# Patient Record
Sex: Female | Born: 1955 | Race: White | Hispanic: No | Marital: Married | Smoking: Never smoker
Health system: Southern US, Community
[De-identification: ages and names within clinical notes are randomized; demographics above are authoritative.]

## PROBLEM LIST (undated history)

## (undated) DIAGNOSIS — G4730 Sleep apnea, unspecified: Secondary | ICD-10-CM

## (undated) DIAGNOSIS — K589 Irritable bowel syndrome without diarrhea: Secondary | ICD-10-CM

## (undated) DIAGNOSIS — E559 Vitamin D deficiency, unspecified: Secondary | ICD-10-CM

## (undated) DIAGNOSIS — D126 Benign neoplasm of colon, unspecified: Secondary | ICD-10-CM

## (undated) DIAGNOSIS — E079 Disorder of thyroid, unspecified: Secondary | ICD-10-CM

## (undated) DIAGNOSIS — K219 Gastro-esophageal reflux disease without esophagitis: Secondary | ICD-10-CM

## (undated) DIAGNOSIS — F329 Major depressive disorder, single episode, unspecified: Secondary | ICD-10-CM

## (undated) DIAGNOSIS — L719 Rosacea, unspecified: Secondary | ICD-10-CM

## (undated) DIAGNOSIS — Z860100 Personal history of colon polyps, unspecified: Secondary | ICD-10-CM

## (undated) DIAGNOSIS — I10 Essential (primary) hypertension: Secondary | ICD-10-CM

## (undated) DIAGNOSIS — E039 Hypothyroidism, unspecified: Secondary | ICD-10-CM

## (undated) DIAGNOSIS — F32A Depression, unspecified: Secondary | ICD-10-CM

## (undated) DIAGNOSIS — K449 Diaphragmatic hernia without obstruction or gangrene: Secondary | ICD-10-CM

## (undated) HISTORY — PX: BREAST EXCISIONAL BIOPSY: SUR124

## (undated) HISTORY — DX: Benign neoplasm of colon, unspecified: D12.6

## (undated) HISTORY — PX: VULVA SURGERY: SHX837

## (undated) HISTORY — PX: BREAST LUMPECTOMY: SHX2

## (undated) HISTORY — DX: Sleep apnea, unspecified: G47.30

## (undated) HISTORY — DX: Essential (primary) hypertension: I10

## (undated) HISTORY — DX: Personal history of colonic polyps: Z86.010

## (undated) HISTORY — DX: Disorder of thyroid, unspecified: E07.9

## (undated) HISTORY — PX: BUNIONECTOMY: SHX129

## (undated) HISTORY — PX: CHOLECYSTECTOMY: SHX55

## (undated) HISTORY — DX: Rosacea, unspecified: L71.9

## (undated) HISTORY — PX: OTHER SURGICAL HISTORY: SHX169

## (undated) HISTORY — DX: Irritable bowel syndrome, unspecified: K58.9

## (undated) HISTORY — DX: Personal history of colon polyps, unspecified: Z86.0100

## (undated) HISTORY — DX: Vitamin D deficiency, unspecified: E55.9

## (undated) HISTORY — DX: Diaphragmatic hernia without obstruction or gangrene: K44.9

---

## 1998-02-03 ENCOUNTER — Ambulatory Visit (HOSPITAL_COMMUNITY): Admission: RE | Admit: 1998-02-03 | Discharge: 1998-02-03 | Payer: Self-pay | Admitting: Gastroenterology

## 1998-06-17 HISTORY — PX: RHINOPLASTY: SUR1284

## 1998-06-17 HISTORY — PX: OTHER SURGICAL HISTORY: SHX169

## 1998-09-05 ENCOUNTER — Ambulatory Visit: Admission: RE | Admit: 1998-09-05 | Discharge: 1998-09-05 | Payer: Self-pay | Admitting: Otolaryngology

## 1998-09-27 ENCOUNTER — Encounter: Payer: Self-pay | Admitting: Gastroenterology

## 1998-09-27 ENCOUNTER — Ambulatory Visit (HOSPITAL_COMMUNITY): Admission: RE | Admit: 1998-09-27 | Discharge: 1998-09-27 | Payer: Self-pay | Admitting: Gastroenterology

## 1998-11-07 ENCOUNTER — Encounter: Payer: Self-pay | Admitting: General Surgery

## 1998-11-07 ENCOUNTER — Ambulatory Visit (HOSPITAL_COMMUNITY): Admission: RE | Admit: 1998-11-07 | Discharge: 1998-11-08 | Payer: Self-pay | Admitting: General Surgery

## 1999-01-03 ENCOUNTER — Ambulatory Visit (HOSPITAL_BASED_OUTPATIENT_CLINIC_OR_DEPARTMENT_OTHER): Admission: RE | Admit: 1999-01-03 | Discharge: 1999-01-03 | Payer: Self-pay | Admitting: General Surgery

## 1999-01-03 ENCOUNTER — Encounter: Payer: Self-pay | Admitting: General Surgery

## 2000-01-29 ENCOUNTER — Ambulatory Visit (HOSPITAL_COMMUNITY): Admission: RE | Admit: 2000-01-29 | Discharge: 2000-01-29 | Payer: Self-pay | Admitting: Internal Medicine

## 2000-01-29 ENCOUNTER — Encounter: Payer: Self-pay | Admitting: Internal Medicine

## 2000-06-02 ENCOUNTER — Emergency Department (HOSPITAL_COMMUNITY): Admission: EM | Admit: 2000-06-02 | Discharge: 2000-06-02 | Payer: Self-pay | Admitting: *Deleted

## 2002-06-17 HISTORY — PX: FINGER SURGERY: SHX640

## 2002-06-29 ENCOUNTER — Encounter (INDEPENDENT_AMBULATORY_CARE_PROVIDER_SITE_OTHER): Payer: Self-pay | Admitting: *Deleted

## 2002-06-29 ENCOUNTER — Encounter: Payer: Self-pay | Admitting: General Surgery

## 2002-06-29 ENCOUNTER — Encounter: Admission: RE | Admit: 2002-06-29 | Discharge: 2002-06-29 | Payer: Self-pay | Admitting: General Surgery

## 2002-09-13 ENCOUNTER — Encounter: Payer: Self-pay | Admitting: General Surgery

## 2002-09-13 ENCOUNTER — Encounter: Admission: RE | Admit: 2002-09-13 | Discharge: 2002-09-13 | Payer: Self-pay | Admitting: General Surgery

## 2002-09-16 ENCOUNTER — Ambulatory Visit (HOSPITAL_BASED_OUTPATIENT_CLINIC_OR_DEPARTMENT_OTHER): Admission: RE | Admit: 2002-09-16 | Discharge: 2002-09-16 | Payer: Self-pay | Admitting: General Surgery

## 2002-09-16 ENCOUNTER — Encounter: Admission: RE | Admit: 2002-09-16 | Discharge: 2002-09-16 | Payer: Self-pay | Admitting: General Surgery

## 2002-09-16 ENCOUNTER — Encounter: Payer: Self-pay | Admitting: General Surgery

## 2002-09-16 ENCOUNTER — Encounter (INDEPENDENT_AMBULATORY_CARE_PROVIDER_SITE_OTHER): Payer: Self-pay | Admitting: Specialist

## 2003-08-05 ENCOUNTER — Encounter: Admission: RE | Admit: 2003-08-05 | Discharge: 2003-08-05 | Payer: Self-pay | Admitting: General Surgery

## 2004-07-10 ENCOUNTER — Ambulatory Visit: Payer: Self-pay | Admitting: Internal Medicine

## 2004-10-04 ENCOUNTER — Encounter: Admission: RE | Admit: 2004-10-04 | Discharge: 2004-10-04 | Payer: Self-pay | Admitting: Obstetrics and Gynecology

## 2005-02-21 ENCOUNTER — Ambulatory Visit: Payer: Self-pay | Admitting: Internal Medicine

## 2005-08-28 ENCOUNTER — Ambulatory Visit: Payer: Self-pay | Admitting: Internal Medicine

## 2005-10-09 ENCOUNTER — Encounter: Admission: RE | Admit: 2005-10-09 | Discharge: 2005-10-09 | Payer: Self-pay | Admitting: Obstetrics and Gynecology

## 2006-04-04 ENCOUNTER — Ambulatory Visit: Payer: Self-pay | Admitting: Internal Medicine

## 2006-04-04 LAB — CONVERTED CEMR LAB
Chol/HDL Ratio, serum: 2.8
Cholesterol: 186 mg/dL (ref 0–200)
HCT: 47.5 % — ABNORMAL HIGH (ref 36.0–46.0)
Hemoglobin: 16.3 g/dL — ABNORMAL HIGH (ref 12.0–15.0)
INR: 1 (ref 0.9–2.0)
LDL Cholesterol: 95 mg/dL (ref 0–99)
Monocytes Absolute: 1 10*3/uL — ABNORMAL HIGH (ref 0.2–0.7)
Neutro Abs: 8 10*3/uL — ABNORMAL HIGH (ref 1.4–7.7)
Platelets: 298 10*3/uL (ref 150–400)
Prothrombin Time: 12.3 s (ref 10.0–14.0)
RBC: 5.07 M/uL (ref 3.87–5.11)
RDW: 12.6 % (ref 11.5–14.6)
TSH: 1.56 microintl units/mL (ref 0.35–5.50)
WBC: 10.7 10*3/uL — ABNORMAL HIGH (ref 4.5–10.5)

## 2006-08-05 ENCOUNTER — Ambulatory Visit (HOSPITAL_BASED_OUTPATIENT_CLINIC_OR_DEPARTMENT_OTHER): Admission: RE | Admit: 2006-08-05 | Discharge: 2006-08-05 | Payer: Self-pay | Admitting: Orthopedic Surgery

## 2006-09-11 ENCOUNTER — Encounter: Admission: RE | Admit: 2006-09-11 | Discharge: 2006-09-11 | Payer: Self-pay | Admitting: Orthopedic Surgery

## 2007-01-22 ENCOUNTER — Encounter: Admission: RE | Admit: 2007-01-22 | Discharge: 2007-01-22 | Payer: Self-pay | Admitting: Obstetrics and Gynecology

## 2007-03-09 ENCOUNTER — Encounter: Payer: Self-pay | Admitting: Internal Medicine

## 2007-03-17 ENCOUNTER — Encounter: Payer: Self-pay | Admitting: Internal Medicine

## 2007-04-03 ENCOUNTER — Encounter: Payer: Self-pay | Admitting: Internal Medicine

## 2007-04-24 ENCOUNTER — Encounter: Payer: Self-pay | Admitting: Internal Medicine

## 2007-07-03 ENCOUNTER — Ambulatory Visit: Payer: Self-pay | Admitting: Internal Medicine

## 2007-07-03 DIAGNOSIS — E8881 Metabolic syndrome: Secondary | ICD-10-CM | POA: Insufficient documentation

## 2007-07-03 DIAGNOSIS — T887XXA Unspecified adverse effect of drug or medicament, initial encounter: Secondary | ICD-10-CM | POA: Insufficient documentation

## 2007-07-03 DIAGNOSIS — R209 Unspecified disturbances of skin sensation: Secondary | ICD-10-CM | POA: Insufficient documentation

## 2007-07-03 DIAGNOSIS — D692 Other nonthrombocytopenic purpura: Secondary | ICD-10-CM | POA: Insufficient documentation

## 2007-07-03 DIAGNOSIS — E782 Mixed hyperlipidemia: Secondary | ICD-10-CM | POA: Insufficient documentation

## 2007-07-03 DIAGNOSIS — E039 Hypothyroidism, unspecified: Secondary | ICD-10-CM | POA: Insufficient documentation

## 2007-07-03 DIAGNOSIS — K209 Esophagitis, unspecified without bleeding: Secondary | ICD-10-CM | POA: Insufficient documentation

## 2007-07-04 LAB — CONVERTED CEMR LAB
ALT: 25 units/L (ref 0–35)
Albumin: 3.8 g/dL (ref 3.5–5.2)
Alkaline Phosphatase: 58 units/L (ref 39–117)
Basophils Absolute: 0 10*3/uL (ref 0.0–0.1)
Bilirubin, Direct: 0.1 mg/dL (ref 0.0–0.3)
Calcium: 9.7 mg/dL (ref 8.4–10.5)
Eosinophils Absolute: 0.1 10*3/uL (ref 0.0–0.6)
Eosinophils Relative: 0.5 % (ref 0.0–5.0)
GFR calc non Af Amer: 70 mL/min
HDL: 94.1 mg/dL (ref >39.0)
Hemoglobin: 15.6 g/dL — ABNORMAL HIGH (ref 12.0–15.0)
MCV: 94.8 fL (ref 78.0–100.0)
Monocytes Absolute: 1 10*3/uL — ABNORMAL HIGH (ref 0.2–0.7)
Neutro Abs: 7.6 10*3/uL (ref 1.4–7.7)
Platelets: 311 10*3/uL (ref 150–400)
Potassium: 3.9 meq/L (ref 3.5–5.1)
RDW: 13.3 % (ref 11.5–14.6)
TSH: 1.61 microintl units/mL (ref 0.35–5.50)
Total CHOL/HDL Ratio: 2.4
Total Protein: 6.3 g/dL (ref 6.0–8.3)
Triglycerides: 73 mg/dL (ref 0–149)
VLDL: 15 mg/dL (ref 0–40)
Vitamin B-12: 320 pg/mL (ref 211–911)
WBC: 10.5 10*3/uL (ref 4.5–10.5)

## 2007-07-06 ENCOUNTER — Encounter (INDEPENDENT_AMBULATORY_CARE_PROVIDER_SITE_OTHER): Payer: Self-pay | Admitting: *Deleted

## 2007-07-09 ENCOUNTER — Encounter: Payer: Self-pay | Admitting: Internal Medicine

## 2007-08-21 ENCOUNTER — Encounter: Payer: Self-pay | Admitting: Internal Medicine

## 2007-10-08 ENCOUNTER — Encounter: Payer: Self-pay | Admitting: Internal Medicine

## 2007-10-12 ENCOUNTER — Ambulatory Visit: Payer: Self-pay | Admitting: Internal Medicine

## 2007-12-04 ENCOUNTER — Encounter (INDEPENDENT_AMBULATORY_CARE_PROVIDER_SITE_OTHER): Payer: Self-pay | Admitting: Obstetrics and Gynecology

## 2007-12-04 ENCOUNTER — Ambulatory Visit (HOSPITAL_COMMUNITY): Admission: RE | Admit: 2007-12-04 | Discharge: 2007-12-04 | Payer: Self-pay | Admitting: Obstetrics and Gynecology

## 2008-02-24 ENCOUNTER — Encounter: Payer: Self-pay | Admitting: Internal Medicine

## 2008-03-10 ENCOUNTER — Ambulatory Visit (HOSPITAL_COMMUNITY): Admission: RE | Admit: 2008-03-10 | Discharge: 2008-03-10 | Payer: Self-pay | Admitting: Gastroenterology

## 2008-03-24 ENCOUNTER — Encounter: Payer: Self-pay | Admitting: Internal Medicine

## 2008-04-12 ENCOUNTER — Encounter: Admission: RE | Admit: 2008-04-12 | Discharge: 2008-04-12 | Payer: Self-pay | Admitting: Obstetrics and Gynecology

## 2008-04-29 ENCOUNTER — Encounter: Payer: Self-pay | Admitting: Internal Medicine

## 2008-11-09 ENCOUNTER — Ambulatory Visit: Payer: Self-pay | Admitting: Internal Medicine

## 2008-11-13 LAB — CONVERTED CEMR LAB
Albumin: 3.9 g/dL (ref 3.5–5.2)
Calcium: 9.1 mg/dL (ref 8.4–10.5)
Creatinine, Ser: 0.8 mg/dL (ref 0.4–1.2)
Direct LDL: 109.6 mg/dL
Glucose, Bld: 86 mg/dL (ref 70–99)
Phosphorus: 3.4 mg/dL (ref 2.3–4.6)
Sodium: 140 meq/L (ref 135–145)

## 2008-11-15 ENCOUNTER — Encounter (INDEPENDENT_AMBULATORY_CARE_PROVIDER_SITE_OTHER): Payer: Self-pay | Admitting: *Deleted

## 2008-11-30 ENCOUNTER — Encounter: Payer: Self-pay | Admitting: Internal Medicine

## 2009-03-17 ENCOUNTER — Telehealth (INDEPENDENT_AMBULATORY_CARE_PROVIDER_SITE_OTHER): Payer: Self-pay | Admitting: *Deleted

## 2009-04-19 ENCOUNTER — Encounter: Admission: RE | Admit: 2009-04-19 | Discharge: 2009-04-19 | Payer: Self-pay | Admitting: Obstetrics and Gynecology

## 2009-04-24 ENCOUNTER — Encounter: Admission: RE | Admit: 2009-04-24 | Discharge: 2009-04-24 | Payer: Self-pay | Admitting: Obstetrics and Gynecology

## 2009-10-09 ENCOUNTER — Telehealth (INDEPENDENT_AMBULATORY_CARE_PROVIDER_SITE_OTHER): Payer: Self-pay | Admitting: *Deleted

## 2010-02-08 ENCOUNTER — Ambulatory Visit: Payer: Self-pay | Admitting: Internal Medicine

## 2010-02-08 DIAGNOSIS — I10 Essential (primary) hypertension: Secondary | ICD-10-CM | POA: Insufficient documentation

## 2010-02-08 LAB — CONVERTED CEMR LAB
Cholesterol, target level: 200 mg/dL
HDL goal, serum: 40 mg/dL
LDL Goal: 160 mg/dL

## 2010-02-13 ENCOUNTER — Telehealth (INDEPENDENT_AMBULATORY_CARE_PROVIDER_SITE_OTHER): Payer: Self-pay | Admitting: *Deleted

## 2010-02-20 ENCOUNTER — Ambulatory Visit: Payer: Self-pay | Admitting: Internal Medicine

## 2010-02-22 ENCOUNTER — Encounter: Payer: Self-pay | Admitting: Internal Medicine

## 2010-02-23 LAB — CONVERTED CEMR LAB
ALT: 20 units/L (ref 0–35)
AST: 18 units/L (ref 0–37)
Albumin: 4 g/dL (ref 3.5–5.2)
BUN: 20 mg/dL (ref 6–23)
Basophils Relative: 0.4 % (ref 0.0–3.0)
Calcium: 9 mg/dL (ref 8.4–10.5)
Chloride: 104 meq/L (ref 96–112)
Cholesterol: 213 mg/dL — ABNORMAL HIGH (ref 0–200)
Creatinine, Ser: 0.8 mg/dL (ref 0.4–1.2)
Direct LDL: 117.1 mg/dL
GFR calc non Af Amer: 74.69 mL/min (ref >60)
HCT: 43.1 % (ref 36.0–46.0)
HDL: 76.4 mg/dL (ref >39.00)
Hemoglobin: 15.2 g/dL — ABNORMAL HIGH (ref 12.0–15.0)
MCHC: 35.3 g/dL (ref 30.0–36.0)
Monocytes Absolute: 0.9 10*3/uL (ref 0.1–1.0)
Neutro Abs: 6.2 10*3/uL (ref 1.4–7.7)
Potassium: 4.5 meq/L (ref 3.5–5.1)
Sodium: 139 meq/L (ref 135–145)
Total Bilirubin: 0.5 mg/dL (ref 0.3–1.2)
Triglycerides: 64 mg/dL (ref 0.0–149.0)
VLDL: 12.8 mg/dL (ref 0.0–40.0)

## 2010-05-01 ENCOUNTER — Encounter: Admission: RE | Admit: 2010-05-01 | Discharge: 2010-05-01 | Payer: Self-pay | Admitting: Obstetrics and Gynecology

## 2010-06-17 HISTORY — PX: OTHER SURGICAL HISTORY: SHX169

## 2010-07-02 ENCOUNTER — Other Ambulatory Visit: Payer: Self-pay | Admitting: Internal Medicine

## 2010-07-02 ENCOUNTER — Ambulatory Visit
Admission: RE | Admit: 2010-07-02 | Discharge: 2010-07-02 | Payer: Self-pay | Source: Home / Self Care | Attending: Internal Medicine | Admitting: Internal Medicine

## 2010-07-02 ENCOUNTER — Encounter: Payer: Self-pay | Admitting: Internal Medicine

## 2010-07-02 LAB — TSH: TSH: 1.65 u[IU]/mL (ref 0.35–5.50)

## 2010-07-17 NOTE — Assessment & Plan Note (Signed)
Summary: MED REFILL /CBS   Vital Signs:  Patient profile:   55 year old female Height:      65 inches Weight:      179.6 pounds BMI:     30.00 Temp:     98.9 degrees F oral Pulse rate:   76 / minute Resp:     16 per minute BP sitting:   136 / 88  (left arm) Cuff size:   large  Vitals Entered By: Shonna Chock CMA (February 08, 2010 3:45 PM) CC: Yearly follow-up , Lipid Management   CC:  Yearly follow-up  and Lipid Management.  History of Present Illness: Jean Young is here for a physical; she has multiple concerns , mainly family health issue  related. He is having back pain after sitting for periods  of time  & when supine overnight due to a partially ruptured disc for which she sees Dr Darrelyn Hillock. Rx: Tylenol 4-6  pills / day(500 mg each).  Lipid Management History:      Positive NCEP/ATP III risk factors include female age 106 years old or older and hypertension.  Negative NCEP/ATP III risk factors include no history of early menopause without estrogen hormone replacement, non-diabetic, HDL cholesterol greater than 60, no family history for ischemic heart disease, non-tobacco-user status, no ASHD (atherosclerotic heart disease), no prior stroke/TIA, no peripheral vascular disease, and no history of aortic aneurysm.     Preventive Screening-Counseling & Management  Alcohol-Tobacco     Smoking Status: never  Caffeine-Diet-Exercise     Does Patient Exercise: yes  Allergies (verified): No Known Drug Allergies  Past History:  Past Medical History: Hypertension Hypothyroidism Sleep Apnea, resolved with ENT Surgery, Dr Dorris Fetch Rosacea Trigger finger IBS, Dr Matthias Hughs Hyperlipidemia(due to elevated HDL). Framingham Study LDL goal = < 160.  Past Surgical History: gravid 2 para 2 Cholecystectomy Uvulectomy & septoplasty for Sleep Apnea, Dr Dorris Fetch; bunionectomy X2;tarsal tunnel surgery X1 Colon polypectomy 2004,negative 2010, Dr Buccini Tonsillectomy Lumpectomy, benign  X 2 , 2000  & 2004, Dr Abbey Chatters  Family History: Family History of Alcoholism/Addiction: paternal family  Father: CVA  Mother: COAD, HTN, hypothyroidism Siblings: sister: cerebral palsy; P aunt : breast cancer; PGM: GI cancer;PGF : ? liver cancer; MGF : leukemia;MGM : dementia, alcoholism  Social History: No diet Occupation: PMHNP, ANP, GNP Married Never Smoked Alcohol use-no Regular exercise-yes: walking 2X/week for 20+  min Does Patient Exercise:  yes  Review of Systems General:  Denies chills, fever, sleep disorder, and sweats; Weight up 12 # related to stress of travel & job . Eyes:  Denies blurring, double vision, and vision loss-both eyes. ENT:  Denies difficulty swallowing and hoarseness. CV:  Complains of difficulty breathing while lying down; denies chest pain or discomfort, leg cramps with exertion, shortness of breath with exertion, swelling of feet, and swelling of hands; Intermittent " atrial fib" with rates to 100-120, once every 3 months w/o trigger.Marland Kitchen Resp:  Denies cough, shortness of breath, and sputum productive. GI:  Denies abdominal pain, bloody stools, dark tarry stools, and indigestion. GU:  Denies discharge, dysuria, and hematuria; Dr Tenny Craw seen annually. MS:  Complains of joint pain and low back pain; denies joint redness and joint swelling; L great toe joint pain, followed by Dr Lestine Box. Derm:  Denies changes in nail beds, dryness, hair loss, and lesion(s). Neuro:  Denies brief paralysis, numbness, tingling, and weakness. Psych:  Complains of anxiety; She sees Dr Laurelyn Sickle. Endo:  Denies cold intolerance, excessive hunger, excessive thirst, excessive urination, and  heat intolerance. Heme:  Denies abnormal bruising and bleeding. Allergy:  Denies itching eyes, seasonal allergies, and sneezing.  Physical Exam  General:  well-nourished; alert,appropriate and cooperative throughout examination Head:  Normocephalic and atraumatic without obvious abnormalities.  Eyes:  No  corneal or conjunctival inflammation noted.Perrla. Funduscopic exam benign, without hemorrhages, exudates or papilledema.  Ears:  External ear exam shows no significant lesions or deformities.  Otoscopic examination reveals clear canals, tympanic membranes are intact bilaterally without bulging, retraction, inflammation or discharge. Hearing is grossly normal bilaterally. Nose:  External nasal examination shows no deformity or inflammation. Nasal mucosa are pink and moist without lesions or exudates. Mouth:  Oral mucosa and oropharynx without lesions or exudates.  Teeth in good repair. Neck:  No deformities, masses, or tenderness noted. Lungs:  Normal respiratory effort, chest expands symmetrically. Lungs are clear to auscultation, no crackles or wheezes. Heart:  Normal rate and regular rhythm. S1 and S2 normal without gallop, murmur, click, rub or other extra sounds. Abdomen:  Bowel sounds positive,abdomen soft and non-tender without masses, organomegaly or hernias noted. Genitalia:  Dr Tenny Craw Msk:  No deformity or scoliosis noted of thoracic or lumbar spine.   Pulses:  R and L carotid,radial,dorsalis pedis and posterior tibial pulses are full and equal bilaterally Extremities:  No clubbing, cyanosis, edema.Deformity of L toe joint  noted with normal full range of motion of all joints.   Neurologic:  alert & oriented X3, strength normal in all extremities, and DTRs symmetrical and normal.   Skin:  Intact without suspicious lesions or rashes Cervical Nodes:  No lymphadenopathy noted Axillary Nodes:  No palpable lymphadenopathy Psych:  memory intact for recent and remote, normally interactive, and good eye contact.     Impression & Recommendations:  Problem # 1:  ROUTINE GENERAL MEDICAL EXAM@HEALTH  CARE FACL (ICD-V70.0)  Orders: EKG w/ Interpretation (93000)  Problem # 2:  HYPERLIPIDEMIA (ICD-272.2)  Problem # 3:  HYPOTHYROIDISM (ICD-244.9)  Her updated medication list for this problem  includes:    Synthroid 50 Mcg Tabs (Levothyroxine sodium) .Marland Kitchen... Take one tablet daily  Problem # 4:  HYPERTENSION (ICD-401.9)  Her updated medication list for this problem includes:    Losartan Potassium-hctz 100-12.5 Mg Tabs (Losartan potassium-hctz) .Marland Kitchen... Take 1 tab once daily  Complete Medication List: 1)  Synthroid 50 Mcg Tabs (Levothyroxine sodium) .... Take one tablet daily 2)  Losartan Potassium-hctz 100-12.5 Mg Tabs (Losartan potassium-hctz) .... Take 1 tab once daily 3)  Wellbutrin Xl 300 Mg Tb24 (Bupropion hcl) .Marland Kitchen.. 1 by mouth qd 4)  Wellbutrin Xl 150 Mg Tb24 (Bupropion hcl) .Marland Kitchen.. 1 by mouth qd 5)  Lamictal 200 Mg Tabs (Lamotrigine) .Marland Kitchen.. 1 by mouth qd 6)  Nexium 40 Mg Cpdr (Esomeprazole magnesium) .Marland Kitchen.. 1 by mouth once daily 7)  Doxycycline Hyclate 100 Mg Tabs (Doxycycline hyclate) .Marland Kitchen.. 1 by mouth once daily 8)  Multivitamins Tabs (Multiple vitamin) .Marland Kitchen.. 1 by mouth once daily 9)  Tramadol Hcl 50 Mg Tabs (Tramadol hcl) .... 1/2 -1 every 6 hrs as needed for back or joint pain  Lipid Assessment/Plan:      Based on NCEP/ATP III, the patient's risk factor category is "0-1 risk factors".  The patient's lipid goals are as follows: Total cholesterol goal is 200; LDL cholesterol goal is 160; HDL cholesterol goal is 40; Triglyceride goal is 150.    Patient Instructions: 1)  Please schedule fasting labs @ [facility]: 2)  BMP; 3)  Hepatic Panel ; 4)  Lipid Panel ; 5)  TSH ; 6)  CBC w/ Diff. 7)  Check your Blood Pressure regularly.Your goal = AVERAGE < 135/85. Prescriptions: TRAMADOL HCL 50 MG TABS (TRAMADOL HCL) 1/2 -1 every 6 hrs as needed for back or joint pain  #30 x 3   Entered and Authorized by:   Marga Melnick MD   Signed by:   Marga Melnick MD on 02/08/2010   Method used:   Faxed to ...       [id number] (retail[facility]       [address]       [place], [place]  [address]       Ph: [phone] or [phone]       Fax: [phone]   RxID:   [id number] LOSARTAN  POTASSIUM-HCTZ 100-12.5 MG TABS (LOSARTAN POTASSIUM-HCTZ) take 1 tab once daily  #90 x 3   Entered and Authorized by:   Marga Melnick MD   Signed by:   Marga Melnick MD on 02/08/2010   Method used:   Faxed to ...       [id number] (retail)       [address]       [place], [place]  [address]       Ph: [phone] or [phone]       Fax: [phone]   RxID:   [id number] SYNTHROID 50 MCG TABS (LEVOTHYROXINE SODIUM) Take one tablet daily  #90 x 3   Entered and Authorized by:   Marga Melnick MD   Signed by:   Marga Melnick MD on 02/08/2010   Method used:   Faxed to ...       [facility] [address] (retail)       [address]       [place], [place]  [address]       Ph: [phone] or [phone]       Fax: [phone]   RxID:   [id number]

## 2010-07-17 NOTE — Progress Notes (Signed)
Summary: lab - lm for patirnt reschedule  ---- Converted from flag ---- ---- 02/13/2010 2:59 PM, Okey Regal Spring wrote: patient didnt keep appt at [facility] lab [phone] - lm at hp # for patient to call & reschedule  ---- 02/09/2010 8:41 AM, Okey Regal Spring wrote: per dr hopper mon, tues, wed for lab at [facility] because of her job -lm at home # patient scheduled [phone] at [facility] - patient to call back if she needs to reschedule  ---- 02/08/2010 4:58 PM, Marga Melnick MD wrote: please schedule fasting labs @ [facility] ------------------------------

## 2010-07-17 NOTE — Progress Notes (Signed)
Summary: Refill Request  Phone Note Refill Request Call back at [phone] Message from:  Pharmacy on October 09, 2009 12:48 PM  Refills Requested: Medication #1:  LOSARTAN POTASSIUM-HCTZ 100-12.5 MG TABS take 1 tab once daily   Dosage confirmed as above?Dosage Confirmed   Supply Requested: 3 months   [facility] [address]   Next Appointment Scheduled: none Initial call taken by: Harold Barban,  October 09, 2009 12:48 PM    Prescriptions: LOSARTAN POTASSIUM-HCTZ 100-12.5 MG TABS (LOSARTAN POTASSIUM-HCTZ) take 1 tab once daily  #90 x 0   Entered by:   Shonna Chock   Authorized by:   Marga Melnick MD   Signed by:   Shonna Chock on 10/09/2009   Method used:   Electronically to        [phone][facility] (retail)       [address]       [place], [place]  [address]       Ph: [phone] or [phone]       Fax: [phone]   RxID:   [id number]

## 2010-07-17 NOTE — Letter (Signed)
Summary: [facility]  [facility]   Imported By: Lanelle Bal 03/06/2010 10:16:51  _____________________________________________________________________  External Attachment:    Type:   Image     Comment:   External Document

## 2010-07-19 NOTE — Miscellaneous (Signed)
Summary: Orders Update  Clinical Lists Changes  Orders: Added new Service order of Venipuncture (98119) - Signed Added new Test order of TLB-TSH (Thyroid Stimulating Hormone) (84443-TSH) - Signed

## 2010-10-30 NOTE — Op Note (Signed)
NAME:  Jean Young, Jean Young             ACCOUNT NO.:  [id number]   MEDICAL RECORD NO.:  [id number]          PATIENT TYPE:  AMB   LOCATION:  [facility]                           FACILITY:  [facility]   PHYSICIAN:  Kendra H. Tenny Craw, MD     DATE OF BIRTH:  April 28, 1956   DATE OF PROCEDURE:  12/04/2007  DATE OF DISCHARGE:                               OPERATIVE REPORT   PREOPERATIVE DIAGNOSES:  1. Dysfunctional uterine bleeding.  2. Endometrial polyp versus submucosal fibroid.   POSTOPERATIVE DIAGNOSES:  1. Dysfunctional uterine bleeding.  2. Endometrial polyps.   PROCEDURE:  1. Hysteroscopy dilation and curettage.  2. Endometrial polypectomy.  3. NovaSure endometrial ablation.   SURGEON:  Freddrick March. Tenny Craw, MD   ASSISTANT:  None.   ANESTHESIA:  General anesthesia with LMA.  Lidocaine 10 mL of 1% was  injected in a paracervical fashion.   OPERATIVE FINDINGS:  Normal-appearing intrauterine cavity with  endometrial polyp noted at the 7 o'clock position.  Tubal ostia  visualized bilaterally.  Of note, the patient has approximately 2+  descensus with a large multiparous cervix, coming down to the level of  the vaginal introitus.  The uterus sounded to 9.5 cm, cervical length  was 5.0 cm, cavity length was 4.5 cm, cavity width was 4.4 cm and time  of ablation was 100 seconds.   SPECIMENS:  Endometrial curettings and endometrial polyp.   DISPOSITION:  Specimens to pathology.   ESTIMATED BLOOD LOSS:  50 mL.   COMPLICATIONS:  None.   PROCEDURE:  Ms. [name] is a  55 year old G3, P 2-0-1-2 who presented  in May 2009, complaining of dysfunctional uterine bleeding.  A  transvaginal ultrasound was performed which demonstrated a thickened  endometrium approximately 1.2 cm and mass at the fundal portion of the  endometrium measuring 0.97 cm consistent with either an endometrial  polyp or submucosal fibroid.  The patient did desire definitive surgical  management and the decision was made to proceed with  hysteroscopy D&C  and either polypectomy or resection of submucosal fibroid.  Additionally, the patient desired endometrial ablation for control of  dysfunctional uterine bleeding.  Following the appropriate informed  consent, the patient was brought to the operating room where general  anesthesia with an LMA was administered.  She was placed in the dorsal  lithotomy position in Richland stirrups, prepped and draped in the normal  sterile fashion.  A single-tooth tenaculum was placed on the anterior  lip of the cervix.  A 10 mL of 1% lidocaine was injected in a  paracervical fashion.  Of note, the cervix was already dilated and a  single 17-French Hanks dilator was needed to dilate the cervix.  The  hysteroscope was then passed transcervically into the uterine cavity.  The intrauterine cavity was noted to be normal in appearance except for  a endometrial polyp at approximately the 7 o'clock position.  Tubal  ostia were noted bilaterally.  Measurements of the cervical length were  then obtained.  The hysteroscope was removed.  Polyp forceps were used  to attempt removal of the polyps; however, this was unsuccessful and a  sharp curettage of the uterus removed, endometrial curettings and the  endometrial polyp.  Once a gritty texture was noted, the NovaSure  ablation array was passed transcervically and deployed.  Cavity  assessment was performed and passed and ablative procedure was performed  for 100 seconds.  This completed the surgical procedure.  The NovaSure  array was removed from the uterine cavity.  The single-tooth tenaculum  was removed from the anterior lip of the cervix.  The hemostasis was  achieved from the tenaculum site with a ring forcep.  Of note, the  cervix was somewhat friable and Monsel's solution was applied to the  cervix to achieve hemostasis of the cervix.  This completed the  procedure.      Freddrick March. Tenny Craw, MD  Electronically Signed     KHR/MEDQ  D:   12/04/2007  T:  12/04/2007  Job:  [id number]

## 2010-10-30 NOTE — H&P (Signed)
NAME:  Jean Young, Jean Young             ACCOUNT NO.:  [id number]   MEDICAL RECORD NO.:  [id number]          PATIENT TYPE:  AMB   LOCATION:  [facility]                           [facility]ILITY:  [facility]   PHYSICIAN:  Kendra H. Tenny Craw, MD     DATE OF BIRTH:  1956/02/22   DATE OF ADMISSION:  DATE OF DISCHARGE:                              HISTORY & PHYSICAL   CHIEF COMPLAINT:  Dysfunctional uterine bleeding.   HISTORY OF PRESENT ILLNESS:  Jean Young is a 55 year old G3, P 2-0-1-  2 who presented to me in the office on Oct 23, 2007 with a several-month  history of irregular bleeding with spotting and prolonged bleeding.  Blood work was performed in the office, which was normal.  A  transvaginal ultrasound was performed on Nov 11, 2007 for intrauterine  evaluation, which demonstrated a uterus with a thickened endometrial  stripe, 1.2 cm, with a globular-appearing area at the fundus of the  endometrium with an echogenic focus measuring 0.97 cm which could be  consistent with either an endometrial polyp versus a submucosal fibroid.  On counseling the patient, it was discussed whether to proceed with a  sono histogram for further evaluation of the intrauterine abnormality or  whether definitive surgical management with hysteroscopy and D&C, and  the patient wished to proceed with hysteroscopy D&C.  Additionally, at  the same time, she desired NovaSure endometrial ablation.  She presents  on December 04, 2007 for hysteroscopy D&C, possible endometrial polypectomy  versus submucosal myomectomy, and NovaSure endometrial ablation.   PAST MEDICAL HISTORY:  Significant for hypertension, hypothyroidism,  gastroesophageal reflux disease, irritable bowel syndrome.   PAST SURGICAL HISTORY:  Significant for cholecystectomy, bunionectomy,  right breast lumpectomy in 2000 and a right breast excision in 2004,  rhinoplasty and uvulectomy for obstructive sleep apnea now resolved,  finger surgery, right foot reconstruction and  bilateral tarsal tunnel  release.   PAST OBSTETRICAL HISTORY:  G3 P2-0-1-2 status post spontaneous vaginal  delivery x2.   PAST GYN HISTORY:  Denies abnormal Pap smears or sexually transmitted  disease.   MEDICATIONS:  1. Wellbutrin XL 400 mg.  2. Synthroid 0.05 mg.  3. Lamictal 200 mg.  4. Benicar.  5. Zegerid 40 mg daily.  6. Lyrica 75 mg daily.  7. Doxycycline 200 mg daily.  8. Multivitamin.   FAMILY HISTORY:  Significant for a paternal aunt diagnosed with breast  cancer at age 32.   PHYSICAL EXAM:  Height 5 feet 5 inches tall, weight 164 pounds, blood  pressure 110/70.  She is alert and oriented x3, in no apparent distress.  HEENT:  Normocephalic, atraumatic.  Moist mucous membranes.  No palpable  lymphadenopathy.  NECK:  Supple.  Regular rate and rhythm.  No murmurs, gallops or rubs.  Clear to  auscultation bilaterally.  GI:  Soft, nontender, nondistended.  No rebound or guarding.  BREASTS:  Symmetric bilaterally.  GU:  Normal external female genitalia.  Vagina rugated.  Cervix without  lesions.  No cervical motion tenderness.  Uterus mid position,  approximately 8 weeks' size.  Adnexa nontender, nonpalpable.  On  transvaginal ultrasound  the uterus measures 7.8 x 4 x 4.5 cm.  In its  thickest portion the endometrium measures 1.2 cm with an echogenic focus  measuring 0.97 cm near the fundal portion of the endometrium which could  be consistent with either a polyp or submucosal fibroid.  Right adnexa  3.1 x 1.8 x 2 cm.  Left adnexa 2.4 x 2 x 1.6 cm.  Normal appearing  bilaterally.  No masses or free fluid are noted.   LABS:  White blood cell count 6.8, hemoglobin 15, platelets 339,  estrogen 20, FSH 8.2, prolactin 12.8.   ASSESSMENT AND PLAN:  This is a 55 year old who presents with  dysfunctional uterine bleeding for hysteroscopy diltation and curettage,  polypectomy versus submucosal myomectomy, and NovaSure ablation.   PLAN:  1. Admit for outpatient surgery.  2.  Obtain preoperative blood work.  3. Cefoxitin 1 gram on call to the operating room.  4. Consent for surgery.      Freddrick March. Tenny Craw, MD  Electronically Signed     KHR/MEDQ  D:  12/03/2007  T:  12/03/2007  Job:  [id number]

## 2010-11-02 NOTE — Letter (Signed)
April 04, 2006    Leonides Grills, M.D.  [address]  [place], [place] [address]   RE:  Jean Young, Jean Young  MRN:  [id number]  /  DOB:  [birth year]   Dear Dr. Lestine Box:   I saw Brock Bad on October 19, concerned about the pain and  paresthesias in the first and second toes of both feet.  She does have a  history of bulging disc at L5-S1.  She has also had occasional numbness  around her mouth one to two times per week on average.   She has been on Indomethacin and a Medrol dose pack.  Apparently, she has a  nerve conduction EMG study scheduled for October 23.   There is no family history of past medical history of gout.  She did have  surgery on the left great toe in 1997.  She is followed by Dr. 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  He feels that it may be part of a systemic problem.  She  does bruise and questions whether she has an abnormal clotting.  She has not  had rectal bleeding or melena or epistaxis.  She has had hematuria and  bacteruria which has been monitored by Dr. Brunilda Payor, a urologist.  There was a  resolving collection of blood in the sclerae on the right.  Blood pressure  was well-controlled at 126/76.  She had no organomegaly or lymphadenopathy.  She did have a prominent vascular pattern over her legs.  Negative straight  leg raising was present.  There was deformity at the base of the first toe,  but no other dramatic musculoskeletal deformities.   Her white count was upper limits of normal to minimally elevated at 10,700  with no left shift.  Hematocrit was 47.5.  PTT and PT were normal as were  lipids and hemoglobin A1c.  TSH was therapeutic.  B12 level and uric acid  were normal.  Specifically, uric acid level was 6.5.  Urine culture revealed  30,000 diphtheroid colonies.  RPR was nonreactive as expected.   Copies of the labs were transmitted to her.  She was concerned about weight  gain,  but as noted thyroid function was normal as was exam of weight itself.  I recommended avoiding high-fructose corn syrup containing foods and drinks  and restricting white carbohydrates.   To facilitate continuity of care, I will send a copy of this to Dr. Ward Givens  and Dr. Su Grand.   Thank you for your care of this sweet lady.    Sincerely,      Titus Dubin. Alwyn Ren, MD,FACP,FCCP    WF[facility]/MedQ  DD: 04/10/2006  DT: 04/11/2006  Job #: [id number]   CC:   Lindaann Slough, M.D.  Ward Givens, M.D.

## 2010-11-02 NOTE — Op Note (Signed)
NAME:  Jean Young, Jean Young             ACCOUNT NO.:  [id number]   MEDICAL RECORD NO.:  [id number]          PATIENT TYPE:  AMB   LOCATION:  DSC                          FACILITY:  [facility]   PHYSICIAN:  Leonides Grills, M.D.     DATE OF BIRTH:  08-22-1955   DATE OF PROCEDURE:  08/05/2006  DATE OF DISCHARGE:                               OPERATIVE REPORT   PREOPERATIVE DIAGNOSES:  1. Right tarsal tunnel syndrome.  2. Right hypermobile first ray  3. Right hallux valgus.  4. Right tight gastroc.   POSTOPERATIVE DIAGNOSES:  1. Right tarsal tunnel syndrome.  2. Right hypermobile first ray  3. Right hallux valgus.  4. Right tight gastroc.   OPERATION:  1. Right tarsal tunnel release.  2. Right gastroc slide.  3. Right first tarsometatarsal joint fusion with osteotomy.  4. Right local bone graft.  5. Right modified McBride bunionectomy.  6. Stress x-rays, right foot.   ANESTHESIA:  General.   SURGEON:  Leonides Grills, M.D.   ASSISTANT:  Evlyn Kanner, P.A.-C.   ESTIMATED BLOOD LOSS:  Minimal.   TOURNIQUET TIME:  Approximately an hour and 45 minutes.   COMPLICATIONS:  None.   DISPOSITION:  Stable to PR.   INDICATIONS:  This is a 55 year old female who has had longstanding  bunion pain with forefoot overload and tarsal tunnel syndrome.  She was  consented for the above procedure.  All risks which include infection,  nerve or vessel injury, prolonged recovery, especially after tarsal  tunnel release, recurrence of deformity, persistent pain, worsening of  pain, nonunion, malunion, hardware irritation, hardware failure, hallux  varus, hallux valgus deformity formation, stiffness, arthritis with  possible future fusion of the great toe MTP joint and possibility of a  symptomatic second hammertoe that was not symptomatic preop, were all  explained.  Questions were encouraged and answered.   OPERATION:  Patient brought the operating room, placed in supine  position after adequate  general tube anesthesia was administered as well  as Ancef 5 grams IV piggyback.  The right lower extremity was prepped  and draped in a sterile manner.  Over a  proximally thigh tourniquet the  limb was gravity exsanguinated.  The right lower extremities prepped and  draped in a sterile manner over proximally thigh tourniquet.  We started  procedure with a longitudinal incision over the right medial aspect of  the gastrocnemius muscle tendinous junction.  Dissection was carried  down through skin.  Hemostasis was obtained.  Fascia was opened in line  with the incision.  Conjoint region was then developed between the  gastroc and soleus.  Soft tissue was then elevated off the posterior  aspect of the gastrocnemius.  Sural nerve was identified and protected  throughout the case.  Gastrocnemius was then released with the curved  Mayo scissors.  This had excellent release tight gastroc.  The area was  copiously irrigated with normal saline.  Subcu was closed 3-0 Vicryl.  Skin was closed with 4-0 Monocryl subcuticular stitch.  Steri-Strips  were applied.  The right lower extremity was gravity exsanguinated.  Tourniquet was elevated to 290  mmHg.  A longitudinal incision over the  dorsal aspect, the interval between the EHL and EHB was then made.  Dissection was carried down through skin.  Hemostasis was obtained.  Neurovascular structures were retracted and protected laterally.  The  interval between EHL and EHB was then developed, hemostasis was  obtained.  The periosteum was elevated off the dorsal aspect the base of  first metatarsal, as well as the distal aspect of the medial cuneiform  joint.  The first TMP joint was then entered osteotomy was then made  that was a closing wedge type, plantar flexion valgus, resecting the  distal aspect in a wedge-shaped manner of the medial cuneiform as well  as a small portion of the base of first metatarsal.  Once this was  removed, this was then placed  on the back table as local bone graft.  Multiple drill holes were placed on either side of the joint.  This was  then reduced in its new position and was in excellent position.  We then  performed a modified McBride bunionectomy.  A midline longitudinal  incision over the medial aspect great right great toe MTP joint was then  made.  Dissection was carried down through skin.  Hemostasis was  obtained.  Neurovascular structures were identified both superiorly and  fell and protected throughout the case.  The capsule was severely  degenerative and actually had a rent on the dorsal aspect medially.  This was carefully dissected out for later repair.  We then made an L-  shaped capsulotomy.  We then made a longitudinal incision dorsal aspect  over the MTP joint of the great toe.  Dissection was carried down  through skin.  Hemostasis was obtained.  Neurovascular structures were  retracted of harm's way.  The abductor hallucis was released as well as  the lateral capsule and the abductor once this was released this freed  up the sesamoids beautifully and we were able to reduce these nicely.  We then went back to the first TMT joint.  With a two-point reduction  clamp the osteotomy and correction was reduced.  A notch was then  created in the base of first metatarsal approximately 2 cm distal to the  first TMT joint.  We then did a 3.5, 2.5 mm drill hole respectively and  placed a 3.5 mm fully threaded cortical lag screw.  This was 40 mm in  length.  This had excellent purchase and compression across the fusion  site.  We then placed another screw lateral to this from the base of the  medial cuneiform into the base of first metatarsal.  This was also  recessed with a bur and this had excellent purchase and maintenance of  the correction.  We then copiously irrigated the area with normal  saline.  A simple bunionectomy also earlier was performed with a sagittal saw and then the area was copiously  irrigated with normal  saline as well as the joint.  We then went back to the first TMT joint  and completed the modified McBride repair advancing the capsule  superiorly and proximally repairing the rent in the capsule as well with  2-0 Vicryl stitch.  This had an outstanding repair of the joint.  The  area was copiously irrigated with normal saline.  Stress strain  relieving bone graft was applied to the first TMP joint surface with a  bur as well as with the local bone graft.  Final stress x-rays were  obtained AP and lateral planes and showed no gross motion across the  fusion site, fixation in proper position and correction excellent as  well.  We made posteromedial incision over the ankle.  Dissection was  carried down through skin.  Hemostasis was obtained.  Flexor retinaculum  was identified.  This was carefully dissected out and then released with  scissors protecting these neurovascular structures deep to this.  This  extended to the adductor hallucis and the fascia was released protecting  the soft tissues on its lateral portion with the Freer.  This had  excellent release and completed the  tarsal tunnel release.  The area was copiously normal saline.  Tourniquet was deflated, hemostasis was obtained.  Subcu was closed with  3-0 Vicryl over all wounds.  Wounds were closed with 4-0 nylon.  Sterile  dressing was applied.  Roger Mann dressing was applied.  Modified Jones  dressing was applied.  The patient was stable to the PR.      Leonides Grills, M.D.  Electronically Signed     PB/MEDQ  D:  08/05/2006  T:  08/05/2006  Job:  [id number]

## 2010-11-02 NOTE — Op Note (Signed)
NAME:  Jean Young, Jean Young                       ACCOUNT NO.:  [id number]   MEDICAL RECORD NO.:  [id number]                   PATIENT TYPE:  AMB   LOCATION:  DSC                                  FACILITY:  [facility]   PHYSICIAN:  Adolph Pollack, M.D.            DATE OF BIRTH:  30-Nov-1955   DATE OF PROCEDURE:  09/16/2002  DATE OF DISCHARGE:                                 OPERATIVE REPORT   PREOPERATIVE DIAGNOSIS:  Abnormal right mammogram.   POSTOPERATIVE DIAGNOSIS:  Abnormal right mammogram.   PROCEDURE:  Right breast biopsy after needle localization.   SURGEON:  Adolph Pollack, M.D.   ANESTHESIA:  General.   INDICATIONS:  The patient is a 55 year old female who underwent a breast  biopsy by me approximately four years ago.  She was having a routine  screening mammogram and a change was noted in the post-biopsy scarring  region, and it was more prominent with an increasing opacity.  A needle core  biopsy was performed and was benign; however, the area persisted and now she  presents for breast biopsy after needle localization.  The procedure and  risks were discussed with her preoperatively.   DESCRIPTION OF PROCEDURE:  She had a successful needle localization by Dr.  Doloris Hall.  She was then brought to the [facility] and seen in  the holding area.  She was brought to the operating room, placed supine on  the operating table, and a general anesthetic was administered.  The bandage  over the right breast was removed and the wire cut.  The right breast and  wire remnant were then sterilely prepped and draped.  Plain Marcaine 0.5%  was infiltrated in the upper outer quadrant of the right breast where the  previous scar was, and the previous scar was re-incised.  This was carried  down for a small distance, raising skin flaps in all directions and the wire  was placed completely into the wound.  A cylinder of tissue was then excised  around the wire and sent for  specimen mammography.  Specimen mammography  confirmed that the area of suspicion was within the specimen.   I then examined the wound.  Bleeding points were controlled with the  electrocautery.  The wound was then inspected again and when hemostasis was  adequate, the subcutaneous tissue was loosely closed with interrupted 3-0  Vicryl sutures.  The skin was closed with a 4-0 Monocryl subcuticular  stitch.  Steri-Strips and sterile dressings were applied.   She tolerated the procedure well without any apparent complications and was  taken to the recovery room in satisfactory condition.  Postoperative  instructions and Vicodin for pain will be given to her, and she will be  followed up in the office in about one week.  Adolph Pollack, M.D.    Kari Baars  D:  09/16/2002  T:  09/17/2002  Job:  [id number]   cc:   S. Kyra Manges, M.D.  [phone] [address]  [place]  [place] [address]  Fax: [phone]   Titus Dubin. Alwyn Ren, M.D. [facility]

## 2011-01-20 ENCOUNTER — Other Ambulatory Visit: Payer: Self-pay | Admitting: Internal Medicine

## 2011-01-21 NOTE — Telephone Encounter (Signed)
Patient needs to schedule a CPX  

## 2011-02-07 ENCOUNTER — Other Ambulatory Visit: Payer: Self-pay | Admitting: Internal Medicine

## 2011-03-14 LAB — CBC
MCHC: 34.7
MCV: 95.2
RBC: 4.66
RDW: 14
WBC: 10.7 — ABNORMAL HIGH

## 2011-03-14 LAB — APTT: aPTT: 24

## 2011-03-14 LAB — TYPE AND SCREEN: ABO/RH(D): A POS

## 2011-03-14 LAB — ABO/RH: ABO/RH(D): A POS

## 2011-03-14 LAB — PROTIME-INR: INR: 0.9

## 2011-04-19 ENCOUNTER — Other Ambulatory Visit: Payer: Self-pay | Admitting: Internal Medicine

## 2011-04-19 ENCOUNTER — Other Ambulatory Visit: Payer: Self-pay | Admitting: Obstetrics and Gynecology

## 2011-04-19 DIAGNOSIS — Z1231 Encounter for screening mammogram for malignant neoplasm of breast: Secondary | ICD-10-CM

## 2011-04-19 NOTE — Telephone Encounter (Signed)
30-DAY SUPPLY ONLY. OVERDUE FOR OFFICE VISIT/2ND ADVISEMENT ON REFILL.

## 2011-04-21 ENCOUNTER — Other Ambulatory Visit: Payer: Self-pay | Admitting: Internal Medicine

## 2011-04-22 ENCOUNTER — Other Ambulatory Visit: Payer: Self-pay | Admitting: Internal Medicine

## 2011-05-03 ENCOUNTER — Ambulatory Visit: Payer: Self-pay | Admitting: Internal Medicine

## 2011-05-06 ENCOUNTER — Encounter: Payer: Self-pay | Admitting: Internal Medicine

## 2011-05-07 ENCOUNTER — Ambulatory Visit (INDEPENDENT_AMBULATORY_CARE_PROVIDER_SITE_OTHER): Payer: 59 | Admitting: Internal Medicine

## 2011-05-07 ENCOUNTER — Encounter: Payer: Self-pay | Admitting: Internal Medicine

## 2011-05-07 DIAGNOSIS — I10 Essential (primary) hypertension: Secondary | ICD-10-CM

## 2011-05-07 DIAGNOSIS — R9431 Abnormal electrocardiogram [ECG] [EKG]: Secondary | ICD-10-CM

## 2011-05-07 DIAGNOSIS — E782 Mixed hyperlipidemia: Secondary | ICD-10-CM

## 2011-05-07 DIAGNOSIS — E559 Vitamin D deficiency, unspecified: Secondary | ICD-10-CM | POA: Insufficient documentation

## 2011-05-07 DIAGNOSIS — R002 Palpitations: Secondary | ICD-10-CM

## 2011-05-07 DIAGNOSIS — E039 Hypothyroidism, unspecified: Secondary | ICD-10-CM

## 2011-05-07 NOTE — Patient Instructions (Signed)
Preventive Health Care: Exercise  30-45  minutes a day, 3-4 days a week. Walking is especially valuable in preventing Osteoporosis. Eat a low-fat diet with lots of fruits and vegetables, up to 7-9 servings per day. Consume less than 30 grams of sugar per day from foods & drinks with High Fructose Corn Syrup as # 1,2,3 or #4 on label.  Blood Pressure Goal  Ideally is an AVERAGE < 135/85. This AVERAGE should be calculated from @ least 5-7 BP readings taken @ different times of day on different days of week. You should not respond to isolated BP readings , but rather the AVERAGE for that week  
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
## 2011-05-08 DIAGNOSIS — R9431 Abnormal electrocardiogram [ECG] [EKG]: Secondary | ICD-10-CM | POA: Insufficient documentation

## 2011-05-08 LAB — BASIC METABOLIC PANEL
CO2: 25 mEq/L (ref 19–32)
Calcium: 9.5 mg/dL (ref 8.4–10.5)
Glucose, Bld: 69 mg/dL — ABNORMAL LOW (ref 70–99)
Sodium: 140 mEq/L (ref 135–145)

## 2011-05-08 LAB — MAGNESIUM: Magnesium: 2.1 mg/dL (ref 1.5–2.5)

## 2011-05-15 ENCOUNTER — Ambulatory Visit
Admission: RE | Admit: 2011-05-15 | Discharge: 2011-05-15 | Disposition: A | Discharge status: Home / Self Care | Payer: 59 | Source: Ambulatory Visit | Attending: Obstetrics and Gynecology | Admitting: Obstetrics and Gynecology

## 2011-05-15 DIAGNOSIS — Z1231 Encounter for screening mammogram for malignant neoplasm of breast: Secondary | ICD-10-CM

## 2011-05-17 ENCOUNTER — Other Ambulatory Visit: Payer: Self-pay | Admitting: Obstetrics and Gynecology

## 2011-06-18 ENCOUNTER — Other Ambulatory Visit: Payer: Self-pay | Admitting: Internal Medicine

## 2011-07-20 ENCOUNTER — Other Ambulatory Visit: Payer: Self-pay | Admitting: Internal Medicine

## 2011-07-22 ENCOUNTER — Other Ambulatory Visit: Payer: Self-pay | Admitting: Internal Medicine

## 2011-07-22 MED ORDER — LEVOTHYROXINE SODIUM 50 MCG PO TABS: 50.0000 ug | ORAL_TABLET | Freq: Every day | ORAL | Status: DC

## 2011-08-12 ENCOUNTER — Other Ambulatory Visit: Payer: Self-pay | Admitting: Gastroenterology

## 2011-10-28 ENCOUNTER — Other Ambulatory Visit: Payer: Self-pay | Admitting: Internal Medicine

## 2011-12-30 ENCOUNTER — Other Ambulatory Visit: Payer: Self-pay | Admitting: Dermatology

## 2012-01-13 ENCOUNTER — Other Ambulatory Visit: Payer: Self-pay | Admitting: Internal Medicine

## 2012-01-13 NOTE — Telephone Encounter (Signed)
Patient needs to schedule CPX in 04/2012

## 2012-04-12 ENCOUNTER — Other Ambulatory Visit: Payer: Self-pay | Admitting: Internal Medicine

## 2012-04-13 NOTE — Telephone Encounter (Signed)
Patient needs to schedule  1.) Med management appt 2.) CPX near future

## 2012-04-13 NOTE — Telephone Encounter (Signed)
Called pt to advise appt needed for an OV. Pt stated will call back when she gets home.  MW

## 2012-05-10 ENCOUNTER — Other Ambulatory Visit: Payer: Self-pay | Admitting: Internal Medicine

## 2012-05-25 ENCOUNTER — Other Ambulatory Visit: Payer: Self-pay | Admitting: Internal Medicine

## 2012-05-25 NOTE — Telephone Encounter (Signed)
Pending appointment 06/2012

## 2012-06-26 ENCOUNTER — Encounter (HOSPITAL_COMMUNITY): Payer: Self-pay | Admitting: Pharmacy Technician

## 2012-06-26 ENCOUNTER — Other Ambulatory Visit (HOSPITAL_COMMUNITY): Payer: Self-pay | Admitting: Orthopedic Surgery

## 2012-06-26 NOTE — Patient Instructions (Addendum)
20 Jean Young  06/26/2012   Your procedure is scheduled on: 07-09-12  Report to [facility] at 0800 AM.  Call this number if you have problems the morning of surgery [phone]   Remember:   Do not eat food or drink liquids :After Midnight.     Take these medicines the morning of surgery with A SIP OF WATER: wellbutrin, nexium, lamictal, synthroid                                SEE [place] PREPARING FOR SURGERY SHEET   Do not wear jewelry, make-up or nail polish.  Do not wear lotions, powders, or perfumes. You may wear deodorant.   Men may shave face and neck.  Do not bring valuables to the hospital.  Contacts, dentures or bridgework may not be worn into surgery.  Leave suitcase in the car. After surgery it may be brought to your room.  For patients admitted to the hospital, checkout time is 11:00 AM the day of discharge.   Please read over the following fact sheets that you were given: MRSA Information, incentive spirometer fact sheet  Call Birdie Sons RN pre op nurse if needed [phone]    FAILURE TO FOLLOW THESE INSTRUCTIONS MAY RESULT IN THE CANCELLATION OF YOUR SURGERY. PATIENT SIGNATURE___________________________________________

## 2012-06-29 ENCOUNTER — Encounter (HOSPITAL_COMMUNITY)
Admission: RE | Admit: 2012-06-29 | Discharge: 2012-06-29 | Disposition: A | Discharge status: Home / Self Care | Payer: 59 | Source: Ambulatory Visit | Attending: Orthopedic Surgery | Admitting: Orthopedic Surgery

## 2012-06-29 ENCOUNTER — Ambulatory Visit (HOSPITAL_COMMUNITY)
Admission: RE | Admit: 2012-06-29 | Discharge: 2012-06-29 | Disposition: A | Discharge status: Home / Self Care | Payer: 59 | Source: Ambulatory Visit | Attending: Orthopedic Surgery | Admitting: Orthopedic Surgery

## 2012-06-29 ENCOUNTER — Other Ambulatory Visit: Payer: Self-pay

## 2012-06-29 ENCOUNTER — Ambulatory Visit (HOSPITAL_COMMUNITY): Admission: RE | Admit: 2012-06-29 | Payer: 59 | Source: Ambulatory Visit

## 2012-06-29 ENCOUNTER — Encounter (HOSPITAL_COMMUNITY): Payer: Self-pay

## 2012-06-29 DIAGNOSIS — R9431 Abnormal electrocardiogram [ECG] [EKG]: Secondary | ICD-10-CM | POA: Insufficient documentation

## 2012-06-29 DIAGNOSIS — M51379 Other intervertebral disc degeneration, lumbosacral region without mention of lumbar back pain or lower extremity pain: Secondary | ICD-10-CM | POA: Insufficient documentation

## 2012-06-29 DIAGNOSIS — Z01810 Encounter for preprocedural cardiovascular examination: Secondary | ICD-10-CM | POA: Insufficient documentation

## 2012-06-29 DIAGNOSIS — M5137 Other intervertebral disc degeneration, lumbosacral region: Secondary | ICD-10-CM | POA: Insufficient documentation

## 2012-06-29 DIAGNOSIS — Z01818 Encounter for other preprocedural examination: Secondary | ICD-10-CM | POA: Insufficient documentation

## 2012-06-29 DIAGNOSIS — M949 Disorder of cartilage, unspecified: Secondary | ICD-10-CM | POA: Insufficient documentation

## 2012-06-29 DIAGNOSIS — Z01812 Encounter for preprocedural laboratory examination: Secondary | ICD-10-CM | POA: Insufficient documentation

## 2012-06-29 DIAGNOSIS — M899 Disorder of bone, unspecified: Secondary | ICD-10-CM | POA: Insufficient documentation

## 2012-06-29 HISTORY — DX: Depression, unspecified: F32.A

## 2012-06-29 HISTORY — DX: Major depressive disorder, single episode, unspecified: F32.9

## 2012-06-29 HISTORY — DX: Gastro-esophageal reflux disease without esophagitis: K21.9

## 2012-06-29 HISTORY — DX: Hypothyroidism, unspecified: E03.9

## 2012-06-29 LAB — COMPREHENSIVE METABOLIC PANEL
ALT: 19 U/L (ref 0–35)
AST: 14 U/L (ref 0–37)
Albumin: 4.1 g/dL (ref 3.5–5.2)
Alkaline Phosphatase: 73 U/L (ref 39–117)
BUN: 17 mg/dL (ref 6–23)
CO2: 24 mEq/L (ref 19–32)
Calcium: 9.5 mg/dL (ref 8.4–10.5)
Chloride: 105 mEq/L (ref 96–112)
Creatinine, Ser: 0.91 mg/dL (ref 0.50–1.10)
GFR calc Af Amer: 80 mL/min — ABNORMAL LOW (ref >90)
GFR calc non Af Amer: 69 mL/min — ABNORMAL LOW (ref >90)
Glucose, Bld: 93 mg/dL (ref 70–99)
Potassium: 4 mEq/L (ref 3.5–5.1)
Sodium: 140 mEq/L (ref 135–145)
Total Bilirubin: 0.3 mg/dL (ref 0.3–1.2)
Total Protein: 6.7 g/dL (ref 6.0–8.3)

## 2012-06-29 LAB — CBC
HCT: 45.3 % (ref 36.0–46.0)
MCH: 31 pg (ref 26.0–34.0)
MCHC: 33.6 g/dL (ref 30.0–36.0)
MCV: 92.3 fL (ref 78.0–100.0)
Platelets: 287 10*3/uL (ref 150–400)
RDW: 14 % (ref 11.5–15.5)
WBC: 6.8 10*3/uL (ref 4.0–10.5)

## 2012-06-29 LAB — URINALYSIS, ROUTINE W REFLEX MICROSCOPIC
Bilirubin Urine: NEGATIVE
Glucose, UA: NEGATIVE mg/dL
Hgb urine dipstick: NEGATIVE
Ketones, ur: NEGATIVE mg/dL
Nitrite: NEGATIVE
Protein, ur: NEGATIVE mg/dL
Specific Gravity, Urine: 1.018 (ref 1.005–1.030)
Urobilinogen, UA: 0.2 mg/dL (ref 0.0–1.0)
pH: 5 (ref 5.0–8.0)

## 2012-06-29 LAB — URINE MICROSCOPIC-ADD ON

## 2012-06-29 LAB — PROTIME-INR
INR: 0.97 (ref 0.00–1.49)
Prothrombin Time: 12.8 seconds (ref 11.6–15.2)

## 2012-06-29 LAB — APTT: aPTT: 28 seconds (ref 24–37)

## 2012-06-29 NOTE — Progress Notes (Signed)
Abnormal UA results routed to Dr. Darrelyn Hillock via [facility]

## 2012-06-30 LAB — URINE CULTURE

## 2012-07-01 NOTE — H&P (Signed)
Jean Young is an 57 y.o. female.   Chief Complaint: back pain HPI: The patient is a 57 year old female who presented with back pain one year ago with no known injury.  She is having to limp when she first takes a step. Her left leg pain radiates down the buttock down the thigh and she has pain with sitting. The patient states that they are doing poorly. The following medication has been used for pain control: Hydrocodone. The patient reports their current pain level to be moderate. The patient has had three ESI in the lumbar spine. Her last injection in November did not provide her with relief.   Past Medical History  Diagnosis Date  . Hypertension   . Thyroid disease     hypothyroidism  . Rosacea     Dr Danella Deis  . IBS (irritable bowel syndrome)   . Hypothyroidism   . Depression   . Sleep apnea     surgery corrected  . GERD (gastroesophageal reflux disease)     Past Surgical History  Procedure Date  . G2 p2    . Cholecystectomy   . Uvulectomy  &septoplasty 2000    for sleep apnea; Dr Pollyann Kennedy, ENT  . Colon polectomy   . Finger surgery 2004    ligament repaired, right pointer finger  . Breast lumpectomy     benign x2  . Bunionectomy     bilateral  . Rhinoplasty 2000    Family History  Problem Relation Age of Onset  . Hypertension Mother   . Hypothyroidism Mother   . Cerebral palsy Sister   . Cancer Paternal Aunt     BREAST  . Dementia Maternal Grandmother   . Cancer Maternal Grandfather     LEUKEMIA  . Cancer Paternal Grandmother     GI  . Cancer Paternal Grandfather     LIVER ??  . Alcohol abuse Other     & addiction..paternal family  . Stroke Father     CVA post severe burns   Social History:  reports that she has never smoked. She has never used smokeless tobacco. She reports that she does not drink alcohol or use illicit drugs.  Allergies:  Allergies  Allergen Reactions  . Pregabalin     Affected memory    Current outpatient  prescriptions buPROPion (WELLBUTRIN XL) 150 MG 24 hr tablet, Take 150 mg by mouth every morning. Pt takes a total of 450 mg. Pt takes with 300 mg to get dose., Disp: , Rfl: ;   buPROPion (WELLBUTRIN XL) 300 MG 24 hr tablet, Take 300 mg by mouth every morning. Pt takes a total of 450 mg. Take with the 150mg , Disp: , Rfl: ;   Calcium Carbonate-Vitamin D (CALCIUM + D PO), Take 1 tablet by mouth daily. , Disp: , Rfl:  Cholecalciferol (VITAMIN D-3 PO), Take 4,000 Units by mouth daily. , Disp: , Rfl: ;   doxycycline (VIBRAMYCIN) 100 MG capsule, Take 100 mg by mouth daily as needed. For outbreaks, Disp: , Rfl: ;   esomeprazole (NEXIUM) 40 MG capsule, Take 40 mg by mouth daily before breakfast., Disp: , Rfl: ;  lamoTRIgine (LAMICTAL) 200 MG tablet, Take 200 mg by mouth every morning. , Disp: , Rfl:  letrozole (FEMARA) 2.5 MG tablet, Take 2.5 mg by mouth daily., Disp: , Rfl: ;   levothyroxine (SYNTHROID, LEVOTHROID) 50 MCG tablet, Take 50 mcg by mouth daily before breakfast., Disp: , Rfl: ;   losartan-hydrochlorothiazide (HYZAAR) 100-12.5 MG per  tablet, Take 1 tablet by mouth every morning., Disp: , Rfl: ;   loteprednol (LOTEMAX) 0.5 % ophthalmic suspension, Place 1 drop into both eyes 2 (two) times daily as needed. Dry eyes, Disp: , Rfl:  Polyethyl Glycol-Propyl Glycol (SYSTANE) 0.4-0.3 % SOLN, Apply 1 drop to eye 2 (two) times daily as needed. Dry eyes, Disp: , Rfl:   Review of Systems  Constitutional: Negative.   HENT: Negative.  Negative for neck pain.   Eyes: Negative.   Respiratory: Negative.   Cardiovascular: Negative.   Gastrointestinal: Negative.   Genitourinary: Negative.   Musculoskeletal: Positive for back pain. Negative for myalgias, joint pain and falls.  Skin: Negative.   Neurological: Negative.   Endo/Heme/Allergies: Negative.   Psychiatric/Behavioral: Negative.     Vitals Weight: 186.5 lb Height: 64.25 in Body Surface Area: 1.96 m Body Mass Index: 31.76 kg/m Pulse: 98  (Regular) BP: 142/76 (Sitting, Left Arm, Standard)   Physical Exam  Constitutional: She is oriented to person, place, and time. She appears well-developed and well-nourished. No distress.  HENT:  Head: Normocephalic and atraumatic.  Right Ear: External ear normal.  Left Ear: External ear normal.  Nose: Nose normal.  Mouth/Throat: Oropharynx is clear and moist.  Eyes: Conjunctivae normal and EOM are normal.  Neck: Normal range of motion. Neck supple. No tracheal deviation present. No thyromegaly present.  Cardiovascular: Normal rate, regular rhythm, normal heart sounds and intact distal pulses.   No murmur heard. Respiratory: Effort normal and breath sounds normal. No respiratory distress. She has no wheezes.  GI: Soft. Bowel sounds are normal. She exhibits no distension and no mass. There is no tenderness.  Musculoskeletal:       Right hip: Normal.       Left hip: Normal.       Lumbar back: She exhibits decreased range of motion, pain and spasm.       Right lower leg: She exhibits no tenderness and no swelling.       Left lower leg: She exhibits no tenderness and no swelling.  Lymphadenopathy:    She has no cervical adenopathy.  Neurological: She is alert and oriented to person, place, and time. She has normal strength. No sensory deficit.       Hyperextension of lumbar spine reproduces the pain into the buttocks, posterior leg thigh and left knee. Neurologically she is intact.    Skin: No rash noted. She is not diaphoretic. No erythema.  Psychiatric: She has a normal mood and affect. Her behavior is normal.     Assessment/Plan Lumbar disc herniation (722.10) Dr. Darrelyn Hillock went back through her MRI report and the last MRI she had was May 03, 2011, one year ago. She had an increase in the disk on the left at L5-S1. Evidence that this is impinging on her dorsal root ganglion. The patient needs a hemilaminectomy microdiskectomy for a disc at L5-S1 on the left. The patient has  had several injections and each time is temporary. She has to drive a lot to [facility]. Risks and benefits of surgery discussed with the patient by Dr. Elmer Bales, Savas Elvin Leotis Shames 07/01/2012, 12:06 PM

## 2012-07-09 ENCOUNTER — Ambulatory Visit (HOSPITAL_COMMUNITY): Admission: RE | Admit: 2012-07-09 | Payer: 59 | Source: Ambulatory Visit | Admitting: Orthopedic Surgery

## 2012-07-09 SURGERY — HEMI-MICRODISCECTOMY LUMBAR LAMINECTOMY LEVEL 1
Anesthesia: General | Laterality: Left

## 2012-07-17 ENCOUNTER — Encounter: Payer: 59 | Admitting: Internal Medicine

## 2012-08-06 ENCOUNTER — Other Ambulatory Visit: Payer: Self-pay | Admitting: Obstetrics and Gynecology

## 2012-08-12 ENCOUNTER — Other Ambulatory Visit: Payer: Self-pay | Admitting: Internal Medicine

## 2012-08-13 NOTE — Telephone Encounter (Signed)
Pending appointment 08/2012, last OV 2012

## 2012-08-20 ENCOUNTER — Other Ambulatory Visit: Payer: Self-pay | Admitting: Internal Medicine

## 2012-08-24 ENCOUNTER — Other Ambulatory Visit: Payer: Self-pay | Admitting: Internal Medicine

## 2012-09-04 ENCOUNTER — Ambulatory Visit (INDEPENDENT_AMBULATORY_CARE_PROVIDER_SITE_OTHER): Payer: 59 | Admitting: Internal Medicine

## 2012-09-04 ENCOUNTER — Encounter: Payer: Self-pay | Admitting: Internal Medicine

## 2012-09-04 VITALS — BP 120/78 | HR 73 | Temp 98.2°F | Resp 14 | Ht 64.03 in | Wt 180.0 lb

## 2012-09-04 DIAGNOSIS — Z860100 Personal history of colon polyps, unspecified: Secondary | ICD-10-CM | POA: Insufficient documentation

## 2012-09-04 DIAGNOSIS — Z0000 Encounter for general adult medical examination without abnormal findings: Secondary | ICD-10-CM

## 2012-09-04 DIAGNOSIS — Z780 Asymptomatic menopausal state: Secondary | ICD-10-CM

## 2012-09-04 LAB — LIPID PANEL
Cholesterol: 210 mg/dL — ABNORMAL HIGH (ref 0–200)
Total CHOL/HDL Ratio: 3 Ratio

## 2012-09-04 LAB — TSH: TSH: 2.182 u[IU]/mL (ref 0.350–4.500)

## 2012-09-04 NOTE — Addendum Note (Signed)
Addended by: Silvio Pate D on: 09/04/2012 03:46 PM   Modules accepted: Orders
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
## 2012-09-04 NOTE — Patient Instructions (Addendum)
Review and correct the record as indicated. Please share record with all medical staff seen.  If you activate the  My Chart system; lab & Xray results will be released directly  to you as soon as I review & address these through the computer. If you choose not to sign up for My Chart within 36 hours of labs being drawn; results will be reviewed & interpretation added before being copied & mailed, causing a delay in getting the results to you.If you do not receive that report within 7-10 days ,please call. Additionally you can use this system to gain direct  access to your records  if  out of town or @ an office of a  physician who is not in  the My Chart network.  This improves continuity of care & places you in control of your medical record.  

## 2012-09-09 LAB — VITAMIN D 1,25 DIHYDROXY
Vitamin D 1, 25 (OH)2 Total: 36 pg/mL (ref 18–72)
Vitamin D2 1, 25 (OH)2: <8 pg/mL
Vitamin D3 1, 25 (OH)2: 36 pg/mL

## 2012-09-13 ENCOUNTER — Other Ambulatory Visit: Payer: Self-pay | Admitting: Internal Medicine

## 2012-09-22 ENCOUNTER — Other Ambulatory Visit: Payer: 59

## 2012-09-23 ENCOUNTER — Other Ambulatory Visit: Payer: Self-pay | Admitting: Neurosurgery

## 2012-09-23 DIAGNOSIS — M545 Low back pain, unspecified: Secondary | ICD-10-CM

## 2012-09-26 ENCOUNTER — Ambulatory Visit
Admission: RE | Admit: 2012-09-26 | Discharge: 2012-09-26 | Disposition: A | Discharge status: Home / Self Care | Payer: 59 | Source: Ambulatory Visit | Attending: Neurosurgery | Admitting: Neurosurgery

## 2012-09-26 DIAGNOSIS — M545 Low back pain, unspecified: Secondary | ICD-10-CM

## 2012-10-01 ENCOUNTER — Ambulatory Visit (INDEPENDENT_AMBULATORY_CARE_PROVIDER_SITE_OTHER)
Admission: RE | Admit: 2012-10-01 | Discharge: 2012-10-01 | Disposition: A | Discharge status: Home / Self Care | Payer: 59 | Source: Ambulatory Visit | Attending: Internal Medicine | Admitting: Internal Medicine

## 2012-10-01 DIAGNOSIS — Z780 Asymptomatic menopausal state: Secondary | ICD-10-CM

## 2012-10-14 ENCOUNTER — Other Ambulatory Visit: Payer: Self-pay | Admitting: Neurosurgery

## 2012-10-21 ENCOUNTER — Other Ambulatory Visit: Payer: Self-pay | Admitting: Internal Medicine

## 2012-10-22 NOTE — Telephone Encounter (Signed)
Med filled.  

## 2012-12-02 ENCOUNTER — Encounter (HOSPITAL_COMMUNITY): Payer: Self-pay | Admitting: Pharmacy Technician

## 2012-12-03 ENCOUNTER — Encounter (HOSPITAL_COMMUNITY): Payer: Self-pay

## 2012-12-03 ENCOUNTER — Encounter (HOSPITAL_COMMUNITY)
Admission: RE | Admit: 2012-12-03 | Discharge: 2012-12-03 | Disposition: A | Discharge status: Home / Self Care | Payer: 59 | Source: Ambulatory Visit | Attending: Neurosurgery | Admitting: Neurosurgery

## 2012-12-03 DIAGNOSIS — Z01812 Encounter for preprocedural laboratory examination: Secondary | ICD-10-CM | POA: Insufficient documentation

## 2012-12-03 LAB — CBC
MCHC: 34.8 g/dL (ref 30.0–36.0)
Platelets: 278 10*3/uL (ref 150–400)
RDW: 13.9 % (ref 11.5–15.5)
WBC: 8.7 10*3/uL (ref 4.0–10.5)

## 2012-12-03 LAB — BASIC METABOLIC PANEL
BUN: 20 mg/dL (ref 6–23)
GFR calc Af Amer: 59 mL/min — ABNORMAL LOW (ref >90)
GFR calc non Af Amer: 51 mL/min — ABNORMAL LOW (ref >90)
Potassium: 3.9 mEq/L (ref 3.5–5.1)
Sodium: 135 mEq/L (ref 135–145)

## 2012-12-03 LAB — SURGICAL PCR SCREEN
MRSA, PCR: NEGATIVE
Staphylococcus aureus: NEGATIVE

## 2012-12-03 NOTE — Pre-Procedure Instructions (Signed)
Jean Young  12/03/2012   Your procedure is scheduled on:  12/11/12   Report to [facility] at 530 AM.  Call this number if you have problems the morning of surgery: [phone]   Remember:   Do not eat food or drink liquids after midnight.   Take these medicines the morning of surgery with A SIP OF WATER: wellbutrin,nexium,synthroid,fehart   Do not wear jewelry, make-up or nail polish.  Do not wear lotions, powders, or perfumes. You may wear deodorant.  Do not shave 48 hours prior to surgery. Men may shave face and neck.  Do not bring valuables to the hospital.  [facility] is not responsible                   for any belongings or valuables.  Contacts, dentures or bridgework may not be worn into surgery.  Leave suitcase in the car. After surgery it may be brought to your room.  For patients admitted to the hospital, checkout time is 11:00 AM the day of  discharge.   Patients discharged the day of surgery will not be allowed to drive  home.  Name and phone number of your driver: family  Special Instructions: Shower using CHG 2 nights before surgery and the night before surgery.  If you shower the day of surgery use CHG.  Use special wash - you have one bottle of CHG for all showers.  You should use approximately 1/3 of the bottle for each shower.   Please read over the following fact sheets that you were given: Pain Booklet, Coughing and Deep Breathing, MRSA Information and Surgical Site Infection Prevention

## 2012-12-10 MED ORDER — CEFAZOLIN SODIUM-DEXTROSE 2-3 GM-% IV SOLR: 2.0000 g | INTRAVENOUS | Status: DC
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
## 2012-12-11 ENCOUNTER — Encounter (HOSPITAL_COMMUNITY): Payer: Self-pay | Admitting: *Deleted

## 2012-12-11 ENCOUNTER — Ambulatory Visit (HOSPITAL_COMMUNITY)
Admission: RE | Admit: 2012-12-11 | Discharge: 2012-12-12 | Disposition: A | Discharge status: Home / Self Care | Payer: 59 | Source: Ambulatory Visit | Attending: Neurosurgery | Admitting: Neurosurgery

## 2012-12-11 ENCOUNTER — Encounter (HOSPITAL_COMMUNITY): Payer: Self-pay | Admitting: Anesthesiology

## 2012-12-11 ENCOUNTER — Ambulatory Visit (HOSPITAL_COMMUNITY): Payer: 59 | Admitting: Anesthesiology

## 2012-12-11 ENCOUNTER — Ambulatory Visit (HOSPITAL_COMMUNITY): Payer: 59

## 2012-12-11 ENCOUNTER — Encounter (HOSPITAL_COMMUNITY)
Admission: RE | Disposition: A | Discharge status: Home / Self Care | Payer: Self-pay | Source: Ambulatory Visit | Attending: Neurosurgery

## 2012-12-11 DIAGNOSIS — Z79899 Other long term (current) drug therapy: Secondary | ICD-10-CM | POA: Insufficient documentation

## 2012-12-11 DIAGNOSIS — I10 Essential (primary) hypertension: Secondary | ICD-10-CM | POA: Insufficient documentation

## 2012-12-11 DIAGNOSIS — M47817 Spondylosis without myelopathy or radiculopathy, lumbosacral region: Secondary | ICD-10-CM | POA: Insufficient documentation

## 2012-12-11 DIAGNOSIS — M51379 Other intervertebral disc degeneration, lumbosacral region without mention of lumbar back pain or lower extremity pain: Secondary | ICD-10-CM | POA: Insufficient documentation

## 2012-12-11 DIAGNOSIS — M5137 Other intervertebral disc degeneration, lumbosacral region: Secondary | ICD-10-CM | POA: Insufficient documentation

## 2012-12-11 HISTORY — PX: LUMBAR LAMINECTOMY/DECOMPRESSION MICRODISCECTOMY: SHX5026

## 2012-12-11 SURGERY — LUMBAR LAMINECTOMY/DECOMPRESSION MICRODISCECTOMY 1 LEVEL
Anesthesia: General | Site: Spine Lumbar | Laterality: Left | Wound class: Clean

## 2012-12-11 MED ORDER — OXYCODONE HCL 5 MG PO TABS
5.0000 mg | ORAL_TABLET | Freq: Once | ORAL | Status: AC | PRN
  Administered 2012-12-11: 5 mg via ORAL

## 2012-12-11 MED ORDER — LIDOCAINE HCL (CARDIAC) 20 MG/ML IV SOLN
INTRAVENOUS | Status: DC | PRN
  Administered 2012-12-11: 100 mg via INTRAVENOUS

## 2012-12-11 MED ORDER — LACTATED RINGERS IV SOLN
INTRAVENOUS | Status: DC | PRN
  Administered 2012-12-11 (×2): via INTRAVENOUS

## 2012-12-11 MED ORDER — ACETAMINOPHEN 650 MG RE SUPP: 650.0000 mg | RECTAL | Status: DC | PRN

## 2012-12-11 MED ORDER — OXYCODONE HCL 5 MG PO TABS
ORAL_TABLET | ORAL | Status: AC
  Filled 2012-12-11: qty 1

## 2012-12-11 MED ORDER — METHOCARBAMOL 500 MG PO TABS: 500.0000 mg | ORAL_TABLET | Freq: Four times a day (QID) | ORAL | Status: DC | PRN

## 2012-12-11 MED ORDER — PHENOL 1.4 % MT LIQD: 1.0000 | OROMUCOSAL | Status: DC | PRN

## 2012-12-11 MED ORDER — MORPHINE SULFATE 2 MG/ML IJ SOLN
1.0000 mg | INTRAMUSCULAR | Status: DC | PRN
  Administered 2012-12-11: 2 mg via INTRAVENOUS
  Filled 2012-12-11: qty 1

## 2012-12-11 MED ORDER — PANTOPRAZOLE SODIUM 40 MG IV SOLR
40.0000 mg | Freq: Every day | INTRAVENOUS | Status: DC
  Administered 2012-12-11: 40 mg via INTRAVENOUS
  Filled 2012-12-11 (×2): qty 40

## 2012-12-11 MED ORDER — SODIUM CHLORIDE 0.9 % IJ SOLN
3.0000 mL | Freq: Two times a day (BID) | INTRAMUSCULAR | Status: DC
  Administered 2012-12-11: 3 mL via INTRAVENOUS

## 2012-12-11 MED ORDER — POLYVINYL ALCOHOL 1.4 % OP SOLN
1.0000 [drp] | Freq: Two times a day (BID) | OPHTHALMIC | Status: DC | PRN
  Filled 2012-12-11: qty 15

## 2012-12-11 MED ORDER — LAMOTRIGINE 200 MG PO TABS
200.0000 mg | ORAL_TABLET | Freq: Every morning | ORAL | Status: DC
  Filled 2012-12-11 (×2): qty 1

## 2012-12-11 MED ORDER — KCL IN DEXTROSE-NACL 20-5-0.45 MEQ/L-%-% IV SOLN
INTRAVENOUS | Status: DC
  Filled 2012-12-11 (×3): qty 1000

## 2012-12-11 MED ORDER — BUPROPION HCL ER (XL) 150 MG PO TB24
150.0000 mg | ORAL_TABLET | Freq: Every day | ORAL | Status: DC
  Filled 2012-12-11 (×2): qty 1

## 2012-12-11 MED ORDER — CEFAZOLIN SODIUM-DEXTROSE 2-3 GM-% IV SOLR
INTRAVENOUS | Status: AC
  Administered 2012-12-11: 2 g via INTRAVENOUS
  Filled 2012-12-11: qty 50

## 2012-12-11 MED ORDER — CALCIUM CARBONATE-VITAMIN D 500-200 MG-UNIT PO TABS
1.0000 | ORAL_TABLET | Freq: Every day | ORAL | Status: DC
  Filled 2012-12-11 (×2): qty 1

## 2012-12-11 MED ORDER — VITAMIN D 50 MCG (2000 UT) PO TABS: 2000.0000 [IU] | ORAL_TABLET | Freq: Every day | ORAL | Status: DC

## 2012-12-11 MED ORDER — ZOLPIDEM TARTRATE 5 MG PO TABS: 5.0000 mg | ORAL_TABLET | Freq: Every evening | ORAL | Status: DC | PRN

## 2012-12-11 MED ORDER — SODIUM CHLORIDE 0.9 % IV SOLN: 250.0000 mL | INTRAVENOUS | Status: DC

## 2012-12-11 MED ORDER — LIDOCAINE-EPINEPHRINE 1 %-1:100000 IJ SOLN
INTRAMUSCULAR | Status: DC | PRN
  Administered 2012-12-11: 5 mL

## 2012-12-11 MED ORDER — METHYLPREDNISOLONE ACETATE 80 MG/ML IJ SUSP
INTRAMUSCULAR | Status: DC | PRN
  Administered 2012-12-11: 80 mg

## 2012-12-11 MED ORDER — VITAMIN D3 25 MCG (1000 UNIT) PO TABS
2000.0000 [IU] | ORAL_TABLET | Freq: Every day | ORAL | Status: DC
  Filled 2012-12-11 (×2): qty 2

## 2012-12-11 MED ORDER — SENNA 8.6 MG PO TABS
1.0000 | ORAL_TABLET | Freq: Two times a day (BID) | ORAL | Status: DC
  Administered 2012-12-11: 8.6 mg via ORAL
  Filled 2012-12-11 (×3): qty 1

## 2012-12-11 MED ORDER — BUPIVACAINE HCL (PF) 0.5 % IJ SOLN
INTRAMUSCULAR | Status: DC | PRN
  Administered 2012-12-11: 5 mL

## 2012-12-11 MED ORDER — METHOCARBAMOL 100 MG/ML IJ SOLN
500.0000 mg | Freq: Four times a day (QID) | INTRAVENOUS | Status: DC | PRN
  Filled 2012-12-11: qty 5

## 2012-12-11 MED ORDER — HYDROMORPHONE HCL PF 1 MG/ML IJ SOLN
INTRAMUSCULAR | Status: AC
  Filled 2012-12-11: qty 1

## 2012-12-11 MED ORDER — ROCURONIUM BROMIDE 100 MG/10ML IV SOLN
INTRAVENOUS | Status: DC | PRN
  Administered 2012-12-11: 50 mg via INTRAVENOUS

## 2012-12-11 MED ORDER — HEMOSTATIC AGENTS (NO CHARGE) OPTIME
TOPICAL | Status: DC | PRN
  Administered 2012-12-11: 1 via TOPICAL

## 2012-12-11 MED ORDER — NORETHINDRONE-ETH ESTRADIOL 0.5-2.5 MG-MCG PO TABS: 1.0000 | ORAL_TABLET | Freq: Every day | ORAL | Status: DC

## 2012-12-11 MED ORDER — OXYCODONE HCL 5 MG/5ML PO SOLN: 5.0000 mg | Freq: Once | ORAL | Status: AC | PRN

## 2012-12-11 MED ORDER — MENTHOL 3 MG MT LOZG: 1.0000 | LOZENGE | OROMUCOSAL | Status: DC | PRN

## 2012-12-11 MED ORDER — LOSARTAN POTASSIUM-HCTZ 100-12.5 MG PO TABS: 1.0000 | ORAL_TABLET | Freq: Every day | ORAL | Status: DC

## 2012-12-11 MED ORDER — LEVOTHYROXINE SODIUM 50 MCG PO TABS
50.0000 ug | ORAL_TABLET | Freq: Every day | ORAL | Status: DC
  Administered 2012-12-12: 50 ug via ORAL
  Filled 2012-12-11 (×2): qty 1

## 2012-12-11 MED ORDER — ONDANSETRON HCL 4 MG/2ML IJ SOLN: 4.0000 mg | INTRAMUSCULAR | Status: DC | PRN

## 2012-12-11 MED ORDER — MIDAZOLAM HCL 5 MG/5ML IJ SOLN
INTRAMUSCULAR | Status: DC | PRN
  Administered 2012-12-11: 2 mg via INTRAVENOUS

## 2012-12-11 MED ORDER — PROMETHAZINE HCL 25 MG/ML IJ SOLN: 6.2500 mg | INTRAMUSCULAR | Status: DC | PRN

## 2012-12-11 MED ORDER — ACETAMINOPHEN 325 MG PO TABS: 650.0000 mg | ORAL_TABLET | ORAL | Status: DC | PRN

## 2012-12-11 MED ORDER — FLEET ENEMA 7-19 GM/118ML RE ENEM
1.0000 | ENEMA | Freq: Once | RECTAL | Status: AC | PRN
  Filled 2012-12-11: qty 1

## 2012-12-11 MED ORDER — HYDROCODONE-ACETAMINOPHEN 5-325 MG PO TABS: 1.0000 | ORAL_TABLET | ORAL | Status: DC | PRN

## 2012-12-11 MED ORDER — POLYETHYLENE GLYCOL 3350 17 G PO PACK
17.0000 g | PACK | Freq: Every day | ORAL | Status: DC | PRN
  Filled 2012-12-11: qty 1

## 2012-12-11 MED ORDER — POLYETHYL GLYCOL-PROPYL GLYCOL 0.4-0.3 % OP SOLN: 1.0000 [drp] | Freq: Two times a day (BID) | OPHTHALMIC | Status: DC | PRN

## 2012-12-11 MED ORDER — 0.9 % SODIUM CHLORIDE (POUR BTL) OPTIME
TOPICAL | Status: DC | PRN
  Administered 2012-12-11: 1000 mL

## 2012-12-11 MED ORDER — FENTANYL CITRATE 0.05 MG/ML IJ SOLN: 50.0000 ug | Freq: Once | INTRAMUSCULAR | Status: DC

## 2012-12-11 MED ORDER — HYDROCHLOROTHIAZIDE 12.5 MG PO CAPS
12.5000 mg | ORAL_CAPSULE | Freq: Every day | ORAL | Status: DC
  Filled 2012-12-11 (×2): qty 1

## 2012-12-11 MED ORDER — SACCHAROMYCES BOULARDII 250 MG PO CAPS
250.0000 mg | ORAL_CAPSULE | Freq: Every day | ORAL | Status: DC
  Filled 2012-12-11: qty 1

## 2012-12-11 MED ORDER — SODIUM CHLORIDE 0.9 % IJ SOLN: 3.0000 mL | INTRAMUSCULAR | Status: DC | PRN

## 2012-12-11 MED ORDER — THROMBIN 5000 UNITS EX SOLR
CUTANEOUS | Status: DC | PRN
  Administered 2012-12-11 (×2): 5000 [IU] via TOPICAL

## 2012-12-11 MED ORDER — BUPROPION HCL ER (XL) 300 MG PO TB24
300.0000 mg | ORAL_TABLET | Freq: Every day | ORAL | Status: DC
  Filled 2012-12-11 (×2): qty 1

## 2012-12-11 MED ORDER — LOSARTAN POTASSIUM 50 MG PO TABS
100.0000 mg | ORAL_TABLET | Freq: Every day | ORAL | Status: DC
  Filled 2012-12-11 (×2): qty 2

## 2012-12-11 MED ORDER — FENTANYL CITRATE 0.05 MG/ML IJ SOLN
INTRAMUSCULAR | Status: DC | PRN
  Administered 2012-12-11: 100 ug via INTRAVENOUS
  Administered 2012-12-11: 50 ug via INTRAVENOUS
  Administered 2012-12-11: 100 ug via INTRAVENOUS

## 2012-12-11 MED ORDER — BISACODYL 10 MG RE SUPP: 10.0000 mg | Freq: Every day | RECTAL | Status: DC | PRN

## 2012-12-11 MED ORDER — PANTOPRAZOLE SODIUM 40 MG PO TBEC: 40.0000 mg | DELAYED_RELEASE_TABLET | Freq: Every day | ORAL | Status: DC

## 2012-12-11 MED ORDER — FENTANYL CITRATE 0.05 MG/ML IJ SOLN
INTRAMUSCULAR | Status: AC
  Filled 2012-12-11: qty 2

## 2012-12-11 MED ORDER — CALCIUM CARBONATE-VITAMIN D 250-125 MG-UNIT PO TABS: 2.0000 | ORAL_TABLET | Freq: Every day | ORAL | Status: DC

## 2012-12-11 MED ORDER — HYDROMORPHONE HCL PF 1 MG/ML IJ SOLN
0.2500 mg | INTRAMUSCULAR | Status: DC | PRN
  Administered 2012-12-11: 0.5 mg via INTRAVENOUS

## 2012-12-11 MED ORDER — FENTANYL CITRATE 0.05 MG/ML IJ SOLN
INTRAMUSCULAR | Status: DC | PRN
  Administered 2012-12-11: 100 ug via INTRAVENOUS

## 2012-12-11 MED ORDER — PROPOFOL 10 MG/ML IV BOLUS
INTRAVENOUS | Status: DC | PRN
  Administered 2012-12-11: 200 mg via INTRAVENOUS

## 2012-12-11 MED ORDER — MIDAZOLAM HCL 2 MG/2ML IJ SOLN: 1.0000 mg | INTRAMUSCULAR | Status: DC | PRN

## 2012-12-11 MED ORDER — CEFAZOLIN SODIUM 1-5 GM-% IV SOLN
1.0000 g | Freq: Three times a day (TID) | INTRAVENOUS | Status: AC
  Administered 2012-12-11 – 2012-12-12 (×2): 1 g via INTRAVENOUS
  Filled 2012-12-11 (×2): qty 50

## 2012-12-11 MED ORDER — ALIGN PO CAPS: 1.0000 | ORAL_CAPSULE | Freq: Every day | ORAL | Status: DC

## 2012-12-11 MED ORDER — OXYCODONE-ACETAMINOPHEN 5-325 MG PO TABS
1.0000 | ORAL_TABLET | ORAL | Status: DC | PRN
  Administered 2012-12-11: 2 via ORAL
  Filled 2012-12-11 (×2): qty 2

## 2012-12-11 SURGICAL SUPPLY — 53 items
BAG DECANTER FOR FLEXI CONT (MISCELLANEOUS) ×2 IMPLANT
BENZOIN TINCTURE PRP APPL 2/3 (GAUZE/BANDAGES/DRESSINGS) IMPLANT
BIT DRILL NEURO 2X3.1 SFT TUCH (MISCELLANEOUS) ×1 IMPLANT
BLADE SURG ROTATE 9660 (MISCELLANEOUS) IMPLANT
BUR ROUND FLUTED 5 RND (BURR) ×2 IMPLANT
CANISTER SUCTION 2500CC (MISCELLANEOUS) ×2 IMPLANT
CLOTH BEACON ORANGE TIMEOUT ST (SAFETY) ×2 IMPLANT
CONT SPEC 4OZ CLIKSEAL STRL BL (MISCELLANEOUS) ×2 IMPLANT
DERMABOND ADVANCED (GAUZE/BANDAGES/DRESSINGS) ×1
DERMABOND ADVANCED .7 DNX12 (GAUZE/BANDAGES/DRESSINGS) ×1 IMPLANT
DRAPE LAPAROTOMY 100X72X124 (DRAPES) ×2 IMPLANT
DRAPE MICROSCOPE LEICA (MISCELLANEOUS) ×2 IMPLANT
DRAPE POUCH INSTRU U-SHP 10X18 (DRAPES) ×2 IMPLANT
DRAPE SURG 17X23 STRL (DRAPES) ×2 IMPLANT
DRESSING TELFA 8X3 (GAUZE/BANDAGES/DRESSINGS) IMPLANT
DRILL NEURO 2X3.1 SOFT TOUCH (MISCELLANEOUS) ×2
DURAPREP 26ML APPLICATOR (WOUND CARE) ×2 IMPLANT
ELECT REM PT RETURN 9FT ADLT (ELECTROSURGICAL) ×2
ELECTRODE REM PT RTRN 9FT ADLT (ELECTROSURGICAL) ×1 IMPLANT
GAUZE SPONGE 4X4 16PLY XRAY LF (GAUZE/BANDAGES/DRESSINGS) IMPLANT
GLOVE BIO SURGEON STRL SZ8 (GLOVE) ×2 IMPLANT
GLOVE BIOGEL PI IND STRL 8 (GLOVE) IMPLANT
GLOVE BIOGEL PI IND STRL 8.5 (GLOVE) ×1 IMPLANT
GLOVE BIOGEL PI INDICATOR 8 (GLOVE)
GLOVE BIOGEL PI INDICATOR 8.5 (GLOVE) ×1
GLOVE ECLIPSE 8.0 STRL XLNG CF (GLOVE) IMPLANT
GLOVE EXAM NITRILE LRG STRL (GLOVE) IMPLANT
GLOVE EXAM NITRILE MD LF STRL (GLOVE) IMPLANT
GLOVE EXAM NITRILE XL STR (GLOVE) IMPLANT
GLOVE EXAM NITRILE XS STR PU (GLOVE) IMPLANT
GOWN BRE IMP SLV AUR LG STRL (GOWN DISPOSABLE) IMPLANT
GOWN BRE IMP SLV AUR XL STRL (GOWN DISPOSABLE) ×2 IMPLANT
GOWN STRL REIN 2XL LVL4 (GOWN DISPOSABLE) ×2 IMPLANT
KIT BASIN OR (CUSTOM PROCEDURE TRAY) ×2 IMPLANT
KIT ROOM TURNOVER OR (KITS) ×2 IMPLANT
NEEDLE HYPO 18GX1.5 BLUNT FILL (NEEDLE) ×2 IMPLANT
NEEDLE HYPO 25X1 1.5 SAFETY (NEEDLE) ×2 IMPLANT
NS IRRIG 1000ML POUR BTL (IV SOLUTION) ×2 IMPLANT
PACK LAMINECTOMY NEURO (CUSTOM PROCEDURE TRAY) ×2 IMPLANT
PAD ARMBOARD 7.5X6 YLW CONV (MISCELLANEOUS) ×6 IMPLANT
RUBBERBAND STERILE (MISCELLANEOUS) ×4 IMPLANT
SPONGE GAUZE 4X4 12PLY (GAUZE/BANDAGES/DRESSINGS) IMPLANT
SPONGE SURGIFOAM ABS GEL SZ50 (HEMOSTASIS) ×2 IMPLANT
STRIP CLOSURE SKIN 1/2X4 (GAUZE/BANDAGES/DRESSINGS) IMPLANT
SUT VIC AB 0 CT1 18XCR BRD8 (SUTURE) ×1 IMPLANT
SUT VIC AB 0 CT1 8-18 (SUTURE) ×1
SUT VIC AB 2-0 CT1 18 (SUTURE) ×2 IMPLANT
SUT VIC AB 3-0 SH 8-18 (SUTURE) ×2 IMPLANT
SYR 20ML ECCENTRIC (SYRINGE) ×2 IMPLANT
SYR 5ML LL (SYRINGE) ×2 IMPLANT
TOWEL OR 17X24 6PK STRL BLUE (TOWEL DISPOSABLE) ×2 IMPLANT
TOWEL OR 17X26 10 PK STRL BLUE (TOWEL DISPOSABLE) ×2 IMPLANT
WATER STERILE IRR 1000ML POUR (IV SOLUTION) ×2 IMPLANT

## 2012-12-11 NOTE — Addendum Note (Signed)
Addendum created 12/11/12 1358 by Atilano Ina, CRNA   Modules edited: Anesthesia Flowsheet

## 2012-12-11 NOTE — Anesthesia Postprocedure Evaluation (Signed)
  Anesthesia Post-op Note  Patient: Jean Young  Procedure(s) Performed: Procedure(s) with comments: LUMBAR LAMINECTOMY/DECOMPRESSION MICRODISCECTOMY 1 LEVEL (Left) - Left L5-S1 Foraminotomy  Patient Location: PACU  Anesthesia Type:General  Level of Consciousness: awake  Airway and Oxygen Therapy: Patient Spontanous Breathing  Post-op Pain: mild  Post-op Assessment: Post-op Vital signs reviewed, Patient's Cardiovascular Status Stable, Respiratory Function Stable, Patent Airway, No signs of Nausea or vomiting and Pain level controlled  Post-op Vital Signs: stable  Complications: No apparent anesthesia complications

## 2012-12-11 NOTE — Brief Op Note (Signed)
12/11/2012  12:53 PM  PATIENT:  Jean Young  57 y.o. female  PRE-OPERATIVE DIAGNOSIS:  Lumbar degenerative disc disease, Lumbago, Lumbar radiculopathy, lumbar stenosis, lumbar spondylosis L 5 S1 left  POST-OPERATIVE DIAGNOSIS: Lumbar degenerative disc disease, Lumbago, Lumbar radiculopathy, lumbar stenosis, lumbar spondylosis L 5 S1 left  PROCEDURE:  Procedure(s) with comments: LUMBAR LAMINECTOMY/DECOMPRESSION MICRODISCECTOMY 1 LEVEL (Left) - Left L5-S1 Foraminotomy with microdissection  SURGEON:  Surgeon(s) and Role:    * Maeola Harman, MD - Primary    * Reinaldo Meeker, MD - Assisting  PHYSICIAN ASSISTANT:   ASSISTANTS: none   ANESTHESIA:   general  EBL:  Total I/O In: 1600 [I.V.:1600] Out: 25 [Blood:25]  BLOOD ADMINISTERED:none  DRAINS: none   LOCAL MEDICATIONS USED:  LIDOCAINE   SPECIMEN:  No Specimen  DISPOSITION OF SPECIMEN:  N/A  COUNTS:  YES  TOURNIQUET:  * No tourniquets in log *  DICTATION: DICTATION: Patient has foraminal stenosis at L 5 S 1 on the left with significant left leg weakness. It was elected to take her to surgery for left L5S1 foraminotomy and decompression of the left L 5 nerve root..  Procedure: Patient was brought to the operating room and following the smooth and uncomplicated induction of general endotracheal anesthesia she was placed in a prone position on the Wilson frame. Low back was prepped and draped in the usual sterile fashion with betadine scrub and DuraPrep. Area of planned incision was infiltrated with local lidocaine. Incision was made in the midline and carried to the lumbodorsal fascia which was incised on the right side of midline. Subperiosteal dissection was performed exposing what was felt to be L5S1 level. Intraoperative x-ray demonstrated marker probe at L5 S1.  A hemi-semi-laminectomy of L5 was performed a high-speed drill and completed with Kerrison rongeurs and a foraminotomy was performed overlying the superior aspect  of the L5 lamina. Ligamentum flavum was detached and removed in a piecemeal fashion and the S1 nerve root was decompressed laterally with removal of the superior aspect of the facet and ligamentum causing nerve root compression. The microscope was brought into the field and the L5 nerve root was also decompressed as it extended laterally and exited the foramen by removing lateral ligament and hypertrophied superior articular process with Kerrison and angled curettes.  At this point it was felt that all neural elements were well decompressed. Thewound was then irrigated with bacitracin saline. Hemostasis was assured with bipolar electrocautery and the interspace was irrigated with Depo-Medrol and fentanyl. The lumbodorsal fascia was closed with 0 Vicryl sutures the subcutaneous tissues reapproximated 2-0 Vicryl inverted sutures and the skin edges were reapproximated with 3-0 Vicryl subcuticular stitch. The wound is dressed with Dermabond. Patient was extubated in the operating room and taken to recovery in stable and satisfactory condition having tolerated his operation well counts were correct at the end of the case.  PLAN OF CARE: Admit for overnight observation  PATIENT DISPOSITION:  PACU - hemodynamically stable.   Delay start of Pharmacological VTE agent (>24hrs) due to surgical blood loss or risk of bleeding: yes

## 2012-12-11 NOTE — Anesthesia Preprocedure Evaluation (Addendum)
Anesthesia Evaluation  Patient identified by MRN, date of birth, ID band Patient awake    Reviewed: Allergy & Precautions, H&P , NPO status , Patient's Chart, lab work & pertinent test results  Airway Mallampati: II TM Distance: <3 FB Neck ROM: Full    Dental  (+) Dental Advisory Given   Pulmonary sleep apnea ,  breath sounds clear to auscultation        Cardiovascular hypertension, Rhythm:Regular Rate:Normal     Neuro/Psych Depression    GI/Hepatic GERD-  Medicated and Controlled,  Endo/Other  Hypothyroidism   Renal/GU      Musculoskeletal   Abdominal (+) + obese,   Peds  Hematology   Anesthesia Other Findings   Reproductive/Obstetrics                          Anesthesia Physical Anesthesia Plan  ASA: II  Anesthesia Plan: General   Post-op Pain Management:    Induction: Intravenous  Airway Management Planned: Oral ETT  Additional Equipment:   Intra-op Plan:   Post-operative Plan: Extubation in OR  Informed Consent: I have reviewed the patients History and Physical, chart, labs and discussed the procedure including the risks, benefits and alternatives for the proposed anesthesia with the patient or authorized representative who has indicated his/her understanding and acceptance.     Plan Discussed with: CRNA and Surgeon  Anesthesia Plan Comments:         Anesthesia Quick Evaluation

## 2012-12-11 NOTE — Interval H&P Note (Signed)
History and Physical Interval Note:  12/11/2012 1:25 PM  Jean Young  has presented today for surgery, with the diagnosis of Lumbar degenerative disc disease, Lumbago, Lumbar radiculopathy  The various methods of treatment have been discussed with the patient and family. After consideration of risks, benefits and other options for treatment, the patient has consented to  Procedure(s) with comments: LUMBAR LAMINECTOMY/DECOMPRESSION MICRODISCECTOMY 1 LEVEL (Left) - Left L5-S1 Foraminotomy as a surgical intervention .  The patient's history has been reviewed, patient examined, no change in status, stable for surgery.  I have reviewed the patient's chart and labs.  Questions were answered to the patient's satisfaction.     Taylormarie Register D

## 2012-12-11 NOTE — Anesthesia Procedure Notes (Signed)
Procedure Name: Intubation Date/Time: 12/11/2012 11:25 AM Performed by: Alanda Amass A Pre-anesthesia Checklist: Patient identified, Timeout performed, Emergency Drugs available, Suction available and Patient being monitored Patient Re-evaluated:Patient Re-evaluated prior to inductionOxygen Delivery Method: Circle system utilized Preoxygenation: Pre-oxygenation with 100% oxygen Intubation Type: IV induction Ventilation: Mask ventilation without difficulty Laryngoscope Size: Mac and 3 Grade View: Grade II Tube type: Oral Tube size: 7.5 mm Number of attempts: 1 Airway Equipment and Method: Stylet Placement Confirmation: ETT inserted through vocal cords under direct vision,  breath sounds checked- equal and bilateral and positive ETCO2 Secured at: 22 cm Tube secured with: Tape Dental Injury: Teeth and Oropharynx as per pre-operative assessment

## 2012-12-11 NOTE — Op Note (Signed)
12/11/2012  12:53 PM  PATIENT:  Jean Young  57 y.o. female  PRE-OPERATIVE DIAGNOSIS:  Lumbar degenerative disc disease, Lumbago, Lumbar radiculopathy, lumbar stenosis, lumbar spondylosis L 5 S1 left  POST-OPERATIVE DIAGNOSIS: Lumbar degenerative disc disease, Lumbago, Lumbar radiculopathy, lumbar stenosis, lumbar spondylosis L 5 S1 left  PROCEDURE:  Procedure(s) with comments: LUMBAR LAMINECTOMY/DECOMPRESSION MICRODISCECTOMY 1 LEVEL (Left) - Left L5-S1 Foraminotomy with microdissection  SURGEON:  Surgeon(s) and Role:    * Shara Hartis, MD - Primary    * Randy O Kritzer, MD - Assisting  PHYSICIAN ASSISTANT:   ASSISTANTS: none   ANESTHESIA:   general  EBL:  Total I/O In: 1600 [I.V.:1600] Out: 25 [Blood:25]  BLOOD ADMINISTERED:none  DRAINS: none   LOCAL MEDICATIONS USED:  LIDOCAINE   SPECIMEN:  No Specimen  DISPOSITION OF SPECIMEN:  N/A  COUNTS:  YES  TOURNIQUET:  * No tourniquets in log *  DICTATION: DICTATION: Patient has foraminal stenosis at L 5 S 1 on the left with significant left leg weakness. It was elected to take her to surgery for left L5S1 foraminotomy and decompression of the left L 5 nerve root..  Procedure: Patient was brought to the operating room and following the smooth and uncomplicated induction of general endotracheal anesthesia she was placed in a prone position on the Wilson frame. Low back was prepped and draped in the usual sterile fashion with betadine scrub and DuraPrep. Area of planned incision was infiltrated with local lidocaine. Incision was made in the midline and carried to the lumbodorsal fascia which was incised on the right side of midline. Subperiosteal dissection was performed exposing what was felt to be L5S1 level. Intraoperative x-ray demonstrated marker probe at L5 S1.  A hemi-semi-laminectomy of L5 was performed a high-speed drill and completed with Kerrison rongeurs and a foraminotomy was performed overlying the superior aspect  of the L5 lamina. Ligamentum flavum was detached and removed in a piecemeal fashion and the S1 nerve root was decompressed laterally with removal of the superior aspect of the facet and ligamentum causing nerve root compression. The microscope was brought into the field and the L5 nerve root was also decompressed as it extended laterally and exited the foramen by removing lateral ligament and hypertrophied superior articular process with Kerrison and angled curettes.  At this point it was felt that all neural elements were well decompressed. Thewound was then irrigated with bacitracin saline. Hemostasis was assured with bipolar electrocautery and the interspace was irrigated with Depo-Medrol and fentanyl. The lumbodorsal fascia was closed with 0 Vicryl sutures the subcutaneous tissues reapproximated 2-0 Vicryl inverted sutures and the skin edges were reapproximated with 3-0 Vicryl subcuticular stitch. The wound is dressed with Dermabond. Patient was extubated in the operating room and taken to recovery in stable and satisfactory condition having tolerated his operation well counts were correct at the end of the case.  PLAN OF CARE: Admit for overnight observation  PATIENT DISPOSITION:  PACU - hemodynamically stable.   Delay start of Pharmacological VTE agent (>24hrs) due to surgical blood loss or risk of bleeding: yes  

## 2012-12-11 NOTE — Interval H&P Note (Signed)
History and Physical Interval Note:  12/11/2012 1:26 PM  Jean Young  has presented today for surgery, with the diagnosis of Lumbar degenerative disc disease, Lumbago, Lumbar radiculopathy  The various methods of treatment have been discussed with the patient and family. After consideration of risks, benefits and other options for treatment, the patient has consented to  Procedure(s) with comments: LUMBAR LAMINECTOMY/DECOMPRESSION MICRODISCECTOMY 1 LEVEL (Left) - Left L5-S1 Foraminotomy as a surgical intervention .  The patient's history has been reviewed, patient examined, no change in status, stable for surgery.  I have reviewed the patient's chart and labs.  Questions were answered to the patient's satisfaction.     Anna-Marie Coller D

## 2012-12-11 NOTE — Transfer of Care (Signed)
Immediate Anesthesia Transfer of Care Note  Patient: Jean Young  Procedure(s) Performed: Procedure(s) with comments: LUMBAR LAMINECTOMY/DECOMPRESSION MICRODISCECTOMY 1 LEVEL (Left) - Left L5-S1 Foraminotomy  Patient Location: PACU  Anesthesia Type:General  Level of Consciousness: awake  Airway & Oxygen Therapy: Patient Spontanous Breathing and Patient connected to nasal cannula oxygen  Post-op Assessment: Report given to PACU RN and Post -op Vital signs reviewed and stable  Post vital signs: Reviewed and stable  Complications: No apparent anesthesia complications

## 2012-12-12 MED ORDER — METHOCARBAMOL 500 MG PO TABS: 500.0000 mg | ORAL_TABLET | Freq: Four times a day (QID) | ORAL | Status: DC | PRN

## 2012-12-12 MED ORDER — OXYCODONE-ACETAMINOPHEN 5-325 MG PO TABS: 1.0000 | ORAL_TABLET | ORAL | Status: DC | PRN

## 2012-12-12 NOTE — Progress Notes (Signed)
OT Cancellation Note  Patient Details Name: Jean Young MRN: [id number] DOB: 1955-07-08   Cancelled Treatment:    Reason Eval/Treat Not Completed: OT screened, no needs identified, will sign off. 12/12/2012 Cipriano Mile OTR/L Pager [phone] Office [phone]

## 2012-12-12 NOTE — Discharge Summary (Signed)
Physician Discharge Summary  Patient ID: Jean Young MRN: [id number] DOB/AGE: Dec 03, 1955 57 y.o.  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 *   Discharged Condition: good  Hospital Course: pt admitted on day of surgery  , underwent procedure below  - pt doing well, ambulating, voiding , tolerating PO  Consults: None    Treatments: surgery: LUMBAR LAMINECTOMY/DECOMPRESSION MICRODISCECTOMY 1 LEVEL (Left) - Left L5-S1 Foraminotomy with microdissection   Discharge Exam: Blood pressure 84/53, pulse 64, temperature 99.1 F (37.3 C), temperature source Oral, resp. rate 14, SpO2 94.00%. Wound:c/d/i  Disposition: home     Medication List         ALIGN PO  Take 1 tablet by mouth daily.     buPROPion 150 MG 24 hr tablet  Commonly known as:  WELLBUTRIN XL  Take 150 mg by mouth daily. Takes with 300mg  tablet to equal dose of 450mg .     buPROPion 300 MG 24 hr tablet  Commonly known as:  WELLBUTRIN XL  Take 300 mg by mouth daily. Takes with 150mg  tablet to equal dose of 450mg .     CALCIUM + D PO  Take 1 tablet by mouth daily.     esomeprazole 40 MG capsule  Commonly known as:  NEXIUM  Take 40 mg by mouth daily before breakfast.     lamoTRIgine 200 MG tablet  Commonly known as:  LAMICTAL  Take 200 mg by mouth every morning.     levothyroxine 50 MCG tablet  Commonly known as:  SYNTHROID, LEVOTHROID  Take 50 mcg by mouth daily before breakfast.     losartan-hydrochlorothiazide 100-12.5 MG per tablet  Commonly known as:  HYZAAR  Take 1 tablet by mouth daily.     methocarbamol 500 MG tablet  Commonly known as:  ROBAXIN  Take 1 tablet (500  mg total) by mouth every 6 (six) hours as needed (spasms).     norethindrone-ethinyl estradiol 0.5-2.5 MG-MCG per tablet  Commonly known as:  FEMHRT LOW DOSE  Take 1 tablet by mouth daily.     oxyCODONE-acetaminophen 5-325 MG per tablet  Commonly known as:  PERCOCET/ROXICET  Take 1-2 tablets by mouth every 4 (four) hours as needed.     SYSTANE 0.4-0.3 % Soln  Generic drug:  Polyethyl Glycol-Propyl Glycol  Apply 1 drop to eye 2 (two) times daily as needed (for dry eyes).     Vitamin D 2000 UNITS tablet  Take 2,000 Units by mouth daily.         Signed: Clydene Fake, MD 12/12/2012, 7:39 AM

## 2012-12-12 NOTE — Evaluation (Signed)
Physical Therapy Evaluation Patient Details Name: Jean Young MRN: [id number] DOB: [birth year] Today's Date: 12/12/2012 Time: 7829-5621 PT Time Calculation (min): 16 min  PT Assessment / Plan / Recommendation History of Present Illness  Patient is a 57 yo female s/p laminectomy/decompression, Lt. L5-S1 foraminotomy.  Clinical Impression  Educated patient on back precautions related to mobility.  Patient independent with mobility and gait.  Did well with stairs.  No further PT needs - PT will sign off.    PT Assessment  Patent does not need any further PT services    Follow Up Recommendations  No PT follow up;Supervision - Intermittent    Does the patient have the potential to tolerate intense rehabilitation      Barriers to Discharge        Equipment Recommendations  None recommended by PT    Recommendations for Other Services     Frequency      Precautions / Restrictions Precautions Precautions: Back Precaution Comments: Reviewed back precautions with patient - able to recall 3/3 Restrictions Weight Bearing Restrictions: No   Pertinent Vitals/Pain       Mobility  Bed Mobility Bed Mobility: Rolling Left;Left Sidelying to Sit;Sit to Sidelying Left Rolling Left: 7: Independent Left Sidelying to Sit: 7: Independent;HOB flat Sit to Sidelying Left: 7: Independent;HOB flat Details for Bed Mobility Assistance: Verbal cues for technique maintaining back precautions.  Patient able to perform with bed flat and no rail. Transfers Transfers: Sit to Stand;Stand to Sit Sit to Stand: 7: Independent Stand to Sit: 7: Independent Ambulation/Gait Ambulation/Gait Assistance: 7: Independent Ambulation Distance (Feet): 200 Feet Assistive device: None Ambulation/Gait Assistance Details: Good gait pattern, balance. Gait Pattern: Within Functional Limits Gait velocity: Slow gait speed Stairs: Yes Stairs Assistance: 6: Modified independent (Device/Increase time) Stair  Management Technique: One rail Right;Step to pattern;Forwards Number of Stairs: 4        PT Goals(Current goals can be found in the care plan section)    Visit Information  Last PT Received On: 12/12/12 Assistance Needed: +1 History of Present Illness: Patient is a 57 yo female s/p laminectomy/decompression, Lt. L5-S1 foraminotomy.       Prior Functioning  Home Living Family/patient expects to be discharged to:: Private residence Living Arrangements: Spouse/significant other Available Help at Discharge: Family;Available PRN/intermittently Type of Home: House Home Access: Stairs to enter [employer] of Steps: 8 Entrance Stairs-Rails: Right;Left;Can reach both Home Layout: One level Home Equipment: None Prior Function Level of Independence: Independent Communication Communication: No difficulties    Cognition  Cognition Arousal/Alertness: Awake/alert Behavior During Therapy: WFL for tasks assessed/performed Overall Cognitive Status: Within Functional Limits for tasks assessed    Extremity/Trunk Assessment Upper Extremity Assessment Upper Extremity Assessment: Overall WFL for tasks assessed Lower Extremity Assessment Lower Extremity Assessment: Overall WFL for tasks assessed   Balance    End of Session PT - End of Session Equipment Utilized During Treatment: Gait belt Activity Tolerance: Patient tolerated treatment well Patient left: with call bell/phone within reach Nurse Communication: Mobility status  GP Functional Assessment Tool Used: Clinical judgement Functional Limitation: Mobility: Walking and moving around Mobility: Walking and Moving Around Current Status (H0865): 0 percent impaired, limited or restricted Mobility: Walking and Moving Around Goal Status (H8469): 0 percent impaired, limited or restricted Mobility: Walking and Moving Around Discharge Status [phone]): 0 percent impaired, limited or restricted   Vena Austria 12/12/2012, 8:28  AM Durenda Hurt. Renaldo Fiddler, [facility] Pager [phone]

## 2012-12-12 NOTE — Progress Notes (Signed)
Pt doing well. Pt given D/C instructions with Rx's, verbal understanding given. Pt D/C'd home via wheelchair @ 0840 per MD order. Rema Fendt, RN

## 2012-12-15 ENCOUNTER — Encounter (HOSPITAL_COMMUNITY): Payer: Self-pay | Admitting: Neurosurgery

## 2013-02-13 ENCOUNTER — Other Ambulatory Visit: Payer: Self-pay | Admitting: Internal Medicine

## 2013-02-17 MED ORDER — LEVOTHYROXINE SODIUM 50 MCG PO TABS: 50.0000 ug | ORAL_TABLET | Freq: Every day | ORAL | Status: DC

## 2013-03-15 ENCOUNTER — Other Ambulatory Visit: Payer: Self-pay | Admitting: Internal Medicine

## 2013-03-15 DIAGNOSIS — I10 Essential (primary) hypertension: Secondary | ICD-10-CM

## 2013-03-15 NOTE — Telephone Encounter (Signed)
Losartan refill sent to [facility]

## 2013-03-16 ENCOUNTER — Other Ambulatory Visit: Payer: Self-pay | Admitting: *Deleted

## 2013-03-16 DIAGNOSIS — E785 Hyperlipidemia, unspecified: Secondary | ICD-10-CM

## 2013-03-16 DIAGNOSIS — I10 Essential (primary) hypertension: Secondary | ICD-10-CM

## 2013-03-16 MED ORDER — LEVOTHYROXINE SODIUM 50 MCG PO TABS: 50.0000 ug | ORAL_TABLET | Freq: Every day | ORAL | Status: DC

## 2013-03-16 MED ORDER — LOSARTAN POTASSIUM-HCTZ 100-12.5 MG PO TABS: 1.0000 | ORAL_TABLET | Freq: Every day | ORAL | Status: DC

## 2013-03-16 NOTE — Telephone Encounter (Signed)
Levothyroxine and Hyzaar refills sent to pharmacy

## 2013-03-19 ENCOUNTER — Other Ambulatory Visit: Payer: Self-pay

## 2013-03-19 DIAGNOSIS — Z1231 Encounter for screening mammogram for malignant neoplasm of breast: Secondary | ICD-10-CM

## 2013-03-21 ENCOUNTER — Encounter: Payer: Self-pay | Admitting: Internal Medicine

## 2013-04-07 ENCOUNTER — Ambulatory Visit
Admission: RE | Admit: 2013-04-07 | Discharge: 2013-04-07 | Disposition: A | Discharge status: Home / Self Care | Payer: 59 | Source: Ambulatory Visit

## 2013-04-07 DIAGNOSIS — Z1231 Encounter for screening mammogram for malignant neoplasm of breast: Secondary | ICD-10-CM

## 2013-04-22 ENCOUNTER — Other Ambulatory Visit: Payer: Self-pay

## 2013-08-01 ENCOUNTER — Encounter: Payer: Self-pay | Admitting: Internal Medicine

## 2013-08-02 ENCOUNTER — Other Ambulatory Visit: Payer: Self-pay | Admitting: Internal Medicine

## 2013-08-02 MED ORDER — LEVOTHYROXINE SODIUM 50 MCG PO TABS: ORAL_TABLET | ORAL | Status: DC

## 2013-08-16 ENCOUNTER — Other Ambulatory Visit: Payer: Self-pay | Admitting: Obstetrics and Gynecology

## 2013-08-23 ENCOUNTER — Encounter: Payer: Self-pay | Admitting: Internal Medicine

## 2013-08-24 ENCOUNTER — Other Ambulatory Visit: Payer: Self-pay | Admitting: *Deleted

## 2013-08-24 DIAGNOSIS — I10 Essential (primary) hypertension: Secondary | ICD-10-CM

## 2013-08-24 MED ORDER — LOSARTAN POTASSIUM-HCTZ 100-12.5 MG PO TABS: 1.0000 | ORAL_TABLET | Freq: Every day | ORAL | Status: DC

## 2013-08-24 NOTE — Telephone Encounter (Signed)
Sent email needing refill on her losartan...Johny Chess

## 2013-11-11 ENCOUNTER — Other Ambulatory Visit: Payer: Self-pay

## 2013-11-11 DIAGNOSIS — I10 Essential (primary) hypertension: Secondary | ICD-10-CM

## 2013-11-11 MED ORDER — LOSARTAN POTASSIUM-HCTZ 100-12.5 MG PO TABS: ORAL_TABLET | ORAL | Status: AC

## 2013-11-12 ENCOUNTER — Other Ambulatory Visit: Payer: Self-pay

## 2013-11-12 DIAGNOSIS — E785 Hyperlipidemia, unspecified: Secondary | ICD-10-CM

## 2013-11-12 MED ORDER — LEVOTHYROXINE SODIUM 50 MCG PO TABS: 50.0000 ug | ORAL_TABLET | Freq: Every day | ORAL | Status: DC

## 2014-03-28 ENCOUNTER — Other Ambulatory Visit: Payer: Self-pay

## 2014-03-28 DIAGNOSIS — Z1239 Encounter for other screening for malignant neoplasm of breast: Secondary | ICD-10-CM

## 2014-04-07 ENCOUNTER — Other Ambulatory Visit: Payer: Self-pay

## 2014-04-07 DIAGNOSIS — Z1231 Encounter for screening mammogram for malignant neoplasm of breast: Secondary | ICD-10-CM

## 2014-04-11 ENCOUNTER — Ambulatory Visit: Payer: 59

## 2014-04-14 ENCOUNTER — Ambulatory Visit
Admission: RE | Admit: 2014-04-14 | Discharge: 2014-04-14 | Disposition: A | Discharge status: Home / Self Care | Payer: 59 | Source: Ambulatory Visit

## 2014-04-14 DIAGNOSIS — Z1231 Encounter for screening mammogram for malignant neoplasm of breast: Secondary | ICD-10-CM

## 2014-09-05 ENCOUNTER — Other Ambulatory Visit: Payer: Self-pay | Admitting: Obstetrics and Gynecology

## 2014-09-06 LAB — CYTOLOGY - PAP

## 2015-01-09 ENCOUNTER — Other Ambulatory Visit: Payer: Self-pay | Admitting: Gastroenterology

## 2015-04-05 ENCOUNTER — Other Ambulatory Visit: Payer: Self-pay

## 2015-04-05 DIAGNOSIS — Z1231 Encounter for screening mammogram for malignant neoplasm of breast: Secondary | ICD-10-CM

## 2015-05-05 ENCOUNTER — Ambulatory Visit: Payer: Self-pay

## 2015-12-04 ENCOUNTER — Other Ambulatory Visit: Payer: Self-pay | Admitting: Obstetrics and Gynecology

## 2015-12-05 LAB — CYTOLOGY - PAP

## 2016-03-25 ENCOUNTER — Other Ambulatory Visit: Payer: Self-pay | Admitting: Obstetrics and Gynecology

## 2016-03-25 DIAGNOSIS — Z1231 Encounter for screening mammogram for malignant neoplasm of breast: Secondary | ICD-10-CM

## 2016-04-01 ENCOUNTER — Ambulatory Visit
Admission: RE | Admit: 2016-04-01 | Discharge: 2016-04-01 | Disposition: A | Discharge status: Home / Self Care | Payer: Self-pay | Source: Ambulatory Visit | Attending: Obstetrics and Gynecology | Admitting: Obstetrics and Gynecology

## 2016-04-01 DIAGNOSIS — Z1231 Encounter for screening mammogram for malignant neoplasm of breast: Secondary | ICD-10-CM

## 2016-10-27 ENCOUNTER — Encounter (HOSPITAL_COMMUNITY): Payer: Self-pay | Admitting: Emergency Medicine

## 2016-10-27 ENCOUNTER — Emergency Department (HOSPITAL_COMMUNITY): Payer: 59

## 2016-10-27 DIAGNOSIS — E039 Hypothyroidism, unspecified: Secondary | ICD-10-CM | POA: Insufficient documentation

## 2016-10-27 DIAGNOSIS — I10 Essential (primary) hypertension: Secondary | ICD-10-CM | POA: Diagnosis not present

## 2016-10-27 DIAGNOSIS — M25511 Pain in right shoulder: Secondary | ICD-10-CM | POA: Diagnosis not present

## 2016-10-27 DIAGNOSIS — Y929 Unspecified place or not applicable: Secondary | ICD-10-CM | POA: Diagnosis not present

## 2016-10-27 DIAGNOSIS — Z79899 Other long term (current) drug therapy: Secondary | ICD-10-CM | POA: Insufficient documentation

## 2016-10-27 DIAGNOSIS — Y9389 Activity, other specified: Secondary | ICD-10-CM | POA: Diagnosis not present

## 2016-10-27 DIAGNOSIS — S8991XA Unspecified injury of right lower leg, initial encounter: Secondary | ICD-10-CM | POA: Diagnosis present

## 2016-10-27 DIAGNOSIS — W0110XA Fall on same level from slipping, tripping and stumbling with subsequent striking against unspecified object, initial encounter: Secondary | ICD-10-CM | POA: Insufficient documentation

## 2016-10-27 DIAGNOSIS — R05 Cough: Secondary | ICD-10-CM | POA: Diagnosis not present

## 2016-10-27 DIAGNOSIS — Y999 Unspecified external cause status: Secondary | ICD-10-CM | POA: Diagnosis not present

## 2016-10-27 DIAGNOSIS — S8001XA Contusion of right knee, initial encounter: Secondary | ICD-10-CM | POA: Insufficient documentation

## 2016-10-27 NOTE — ED Triage Notes (Signed)
Pt from home following a fall. Pt states she was planting plants and she tripped landing on her right knee and right shoulder. Pt has limited mobility of her right shoulder and slight swelling in her right knee. Pt was ambulatory at time of assessment.

## 2016-10-28 ENCOUNTER — Emergency Department (HOSPITAL_COMMUNITY): Payer: 59

## 2016-10-28 ENCOUNTER — Emergency Department (HOSPITAL_COMMUNITY)
Admission: EM | Admit: 2016-10-28 | Discharge: 2016-10-28 | Disposition: A | Discharge status: Home / Self Care | Payer: 59 | Attending: Emergency Medicine | Admitting: Emergency Medicine

## 2016-10-28 DIAGNOSIS — W19XXXA Unspecified fall, initial encounter: Secondary | ICD-10-CM

## 2016-10-28 DIAGNOSIS — R059 Cough, unspecified: Secondary | ICD-10-CM

## 2016-10-28 DIAGNOSIS — R05 Cough: Secondary | ICD-10-CM

## 2016-10-28 DIAGNOSIS — M25511 Pain in right shoulder: Secondary | ICD-10-CM

## 2016-10-28 DIAGNOSIS — S8001XA Contusion of right knee, initial encounter: Secondary | ICD-10-CM

## 2016-10-28 MED ORDER — HYDROCODONE-ACETAMINOPHEN 5-325 MG PO TABS: 1.0000 | ORAL_TABLET | ORAL | 0 refills | Status: DC | PRN

## 2016-10-28 MED ORDER — IBUPROFEN 200 MG PO TABS
600.0000 mg | ORAL_TABLET | Freq: Once | ORAL | Status: AC
  Administered 2016-10-28: 600 mg via ORAL
  Filled 2016-10-28: qty 3

## 2016-10-28 MED ORDER — OXYCODONE-ACETAMINOPHEN 5-325 MG PO TABS
1.0000 | ORAL_TABLET | Freq: Once | ORAL | Status: AC
  Administered 2016-10-28: 1 via ORAL
  Filled 2016-10-28: qty 1

## 2016-10-28 NOTE — ED Notes (Signed)
Bed: WA06 Expected date:  Expected time:  Means of arrival:  Comments: 

## 2016-10-28 NOTE — ED Notes (Signed)
Pt. Requested to wait until she went to x-ray to have the arm sling applied.

## 2016-10-28 NOTE — ED Provider Notes (Signed)
[facility] DEPT Provider Note   CSN: [id number] Arrival date & time: 10/27/16  2149  By signing my name below, I, Margit Banda, attest that this documentation has been prepared under the direction and in the presence of Jola Schmidt, MD. Electronically Signed: Margit Banda, ED Scribe. 10/28/16. 1:10 AM.   History   Chief Complaint No chief complaint on file.   HPI Jean Young is a 61 y.o. female who presents to the Emergency Department complaining of moderate right shoulder pain that started earlier in the day on 10/27/16. Pt was planting flowers when she tripped, and fell, hitting the inside of her right knee and right shoulder. She didn't take anything at home for pain. Movement exacerbates her pain. Additionally, pt c/o of a cough that started ~ 6 months ago.   The history is provided by the patient. No language interpreter was used.    Past Medical History:  Diagnosis Date  . Depression   . GERD (gastroesophageal reflux disease)   . History of colonic polyps   . Hypertension   . Hypothyroidism   . IBS (irritable bowel syndrome)   . Rosacea    Dr Tonia Brooms  . Sleep apnea    surgery corrected  . Thyroid disease    hypothyroidism    Patient Active Problem List   Diagnosis Date Noted  . Personal history of colonic polyps 09/04/2012  . Nonspecific abnormal electrocardiogram (ECG) (EKG) 05/08/2011  . Unspecified vitamin D deficiency 05/07/2011  . HYPERTENSION 02/08/2010  . HYPOTHYROIDISM 07/03/2007  . HYPERLIPIDEMIA 07/03/2007  . 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 GERD 07/03/2007  . Disturbance of skin sensation 07/03/2007    Past Surgical History:  Procedure Laterality Date  . BREAST LUMPECTOMY     benign x2  . BUNIONECTOMY     bilateral  . CHOLECYSTECTOMY    . colon polectomy  2012   Dr Cristina Gong, GI  . ESI  2012-13   X3; Dr Nelva Bush  . FINGER SURGERY  2004   ligament repaired, right pointer finger  . g2 p2     . LUMBAR  LAMINECTOMY/DECOMPRESSION MICRODISCECTOMY Left 12/11/2012   Procedure: LUMBAR LAMINECTOMY/DECOMPRESSION MICRODISCECTOMY 1 LEVEL;  Surgeon: Erline Levine, MD;  Location: [facility];  Service: Neurosurgery;  Laterality: Left;  Left L5-S1 Foraminotomy  . RHINOPLASTY  2000  . uvulectomy  &septoplasty  2000   for sleep apnea; Dr Constance Holster, ENT    OB History    No data available       Home Medications    Prior to Admission medications   Medication Sig Start Date End Date Taking? Authorizing Provider  buPROPion (WELLBUTRIN XL) 150 MG 24 hr tablet Take 150 mg by mouth daily. Takes with 300mg  tablet to equal dose of 450mg .    [provider]  buPROPion (WELLBUTRIN XL) 300 MG 24 hr tablet Take 300 mg by mouth daily. Takes with 150mg  tablet to equal dose of 450mg .    [provider]  Calcium Carbonate-Vitamin D (CALCIUM + D PO) Take 1 tablet by mouth daily.     [provider]  Cholecalciferol (VITAMIN D) 2000 UNITS tablet Take 2,000 Units by mouth daily.    [provider]  esomeprazole (NEXIUM) 40 MG capsule Take 40 mg by mouth daily before breakfast.    [provider]  lamoTRIgine (LAMICTAL) 200 MG tablet Take 200 mg by mouth every morning.    [provider]  levothyroxine (SYNTHROID, LEVOTHROID) 50 MCG tablet TAKE 1 TABLET BY MOUTH  EVERY DAY 08/02/13   Hendricks Limes, MD  levothyroxine (SYNTHROID, LEVOTHROID) 50 MCG tablet Take 1 tablet (50 mcg total) by mouth daily before breakfast. 11/12/13   Hendricks Limes, MD  losartan-hydrochlorothiazide [facility]) 100-12.5 MG per tablet TAKE 1 TABLET BY MOUTH EVERY DAY 03/15/13   Hendricks Limes, MD  losartan-hydrochlorothiazide (HYZAAR) 100-12.5 MG per tablet Take 1 tablet by mouth daily. 08/24/13   Hendricks Limes, MD  losartan-hydrochlorothiazide [facility]) 100-12.5 MG per tablet TAKE 1 TABLET BY MOUTH EVERY DAY 11/11/13   Hendricks Limes, MD  methocarbamol (ROBAXIN) 500 MG tablet Take 1 tablet (500  mg total) by mouth every 6 (six) hours as needed (spasms). 12/12/12   Hazle Coca, MD  norethindrone-ethinyl estradiol (FEMHRT LOW DOSE) 0.5-2.5 MG-MCG per tablet Take 1 tablet by mouth daily.    [provider]  oxyCODONE-acetaminophen (PERCOCET/ROXICET) 5-325 MG per tablet Take 1-2 tablets by mouth every 4 (four) hours as needed. 12/12/12   Hazle Coca, MD  Polyethyl Glycol-Propyl Glycol (SYSTANE) 0.4-0.3 % SOLN Apply 1 drop to eye 2 (two) times daily as needed (for dry eyes).     [provider]  Probiotic Product (ALIGN PO) Take 1 tablet by mouth daily.    [provider]    Family History Family History  Problem Relation Age of Onset  . Hypertension Mother   . Hypothyroidism Mother   . Cerebral palsy Sister   . Breast cancer Paternal Aunt   . Dementia Maternal Grandmother   . Cancer Maternal Grandfather        LEUKEMIA  . Cancer Paternal Grandmother        GI  . Cancer Paternal Grandfather        LIVER ??  . Alcohol abuse Other        & addiction..paternal family  . Stroke Father        CVA post severe burns  . Diabetes Neg Hx     Social History Social History  Substance Use Topics  . Smoking status: Never Smoker  . Smokeless tobacco: Never Used  . Alcohol use No     Allergies   Pregabalin   Review of Systems Review of Systems  A complete 10 system review of systems was obtained and all systems are negative except as noted in the HPI and PMH.    Physical Exam Updated Vital Signs BP (!) 143/61 (BP Location: Right Arm)   Pulse 93   Temp 98.2 F (36.8 C) (Oral)   Resp (!) 99   SpO2 99%   Physical Exam  Constitutional: She is oriented to person, place, and time. She appears well-developed and well-nourished.  HENT:  Head: Normocephalic.  Eyes: EOM are normal.  Neck: Normal range of motion.  Cardiovascular: Normal rate.   Pulmonary/Chest: Effort normal and breath sounds normal.  Abdominal: She exhibits no distension.    Musculoskeletal:  Full range of motion bilateral hips, knees, ankles.  Contusion and small ecchymosis over the medial aspect of the right knee.  No ligamentous instability.  Full range of motion of bilateral wrists and elbows.  Mild pain with active range of motion of right shoulder but no significant pain with passive range of motion.  No obvious deformity of the right shoulder or right clavicle  Neurological: She is alert and oriented to person, place, and time.  Psychiatric: She has a normal mood and affect.  Nursing note and vitals reviewed.    ED Treatments / Results  DIAGNOSTIC STUDIES: Oxygen  Saturation is 99% on RA, normal by my interpretation.   COORDINATION OF CARE: 1:08 AM-Discussed next steps with pt. Pt verbalized understanding and is agreeable with the plan.    Labs (all labs ordered are listed, but only abnormal results are displayed) Labs Reviewed - No data to display  EKG  EKG Interpretation None       Radiology Dg Shoulder Right  Result Date: 10/27/2016 CLINICAL DATA:  61 y/o  F; status post fall with shoulder pain. EXAM: RIGHT SHOULDER - 2+ VIEW COMPARISON:  None. FINDINGS: There is no evidence of fracture or dislocation. There is no evidence of arthropathy or other focal bone abnormality. Soft tissues are unremarkable. IMPRESSION: Negative. Electronically Signed   By: Kristine Garbe M.D.   On: 10/27/2016 23:17   Dg Knee Complete 4 Views Right  Result Date: 10/27/2016 CLINICAL DATA:  61 y/o F; status post fall with right knee pain medially. EXAM: RIGHT KNEE - COMPLETE 4+ VIEW COMPARISON:  None. FINDINGS: No evidence of fracture, dislocation, or joint effusion. Small patellofemoral compartment osteophytes. Ossific bodies measuring 7 and 14 mm project over the mid anterior joint space. IMPRESSION: 1.  No acute fracture or dislocation identified. 2. Intra-articular bony bodies. Electronically Signed   By: Kristine Garbe M.D.   On: 10/27/2016  23:19    Procedures Procedures (including critical care time)  Medications Ordered in ED Medications  ibuprofen (ADVIL,MOTRIN) tablet 600 mg (not administered)  oxyCODONE-acetaminophen (PERCOCET/ROXICET) 5-325 MG per tablet 1 tablet (not administered)     Initial Impression / Assessment and Plan / ED Course  I have reviewed the triage vital signs and the nursing notes.  Pertinent labs & imaging results that were available during my care of the patient were reviewed by me and considered in my medical decision making (see chart for details).     X-rays without abnormalities.  Patient with peak follow-up for ongoing shoulder pain.  The majority of her pain is more with active range of motion and therefore she may have ligamentous injury.  No obvious osseous injury.  Patient will follow-up with her primary care doctor regarding her cough over the past 6 months.  Chest x-rays without significant abnormality here in the ER today.  Final Clinical Impressions(s) / ED Diagnoses   Final diagnoses:  None    New Prescriptions New Prescriptions   No medications on file    I personally performed the services described in this documentation, which was scribed in my presence. The recorded information has been reviewed and is accurate.       Jola Schmidt, MD 10/28/16 [phone]

## 2016-10-28 NOTE — ED Notes (Signed)
Pt has kindly declined re-assement vital signs.

## 2017-03-27 ENCOUNTER — Other Ambulatory Visit: Payer: Self-pay | Admitting: Obstetrics and Gynecology

## 2017-03-27 DIAGNOSIS — Z1231 Encounter for screening mammogram for malignant neoplasm of breast: Secondary | ICD-10-CM

## 2017-04-15 ENCOUNTER — Ambulatory Visit
Admission: RE | Admit: 2017-04-15 | Discharge: 2017-04-15 | Disposition: A | Discharge status: Home / Self Care | Payer: 59 | Source: Ambulatory Visit | Attending: Obstetrics and Gynecology | Admitting: Obstetrics and Gynecology

## 2017-04-15 DIAGNOSIS — Z1231 Encounter for screening mammogram for malignant neoplasm of breast: Secondary | ICD-10-CM

## 2017-06-17 HISTORY — PX: ROTATOR CUFF REPAIR: SHX139

## 2018-03-17 HISTORY — PX: THORACIC LAMINECTOMY: SHX96

## 2018-05-17 IMAGING — MG 2D DIGITAL SCREENING BILATERAL MAMMOGRAM WITH CAD AND ADJUNCT TO
8 of 12 series · 8 of 28 positions shown · non-contrast
Comparison: Previous exam(s).

CLINICAL DATA: Screening.

EXAM:
2D DIGITAL SCREENING BILATERAL MAMMOGRAM WITH CAD AND ADJUNCT TOMO

[R MLO]
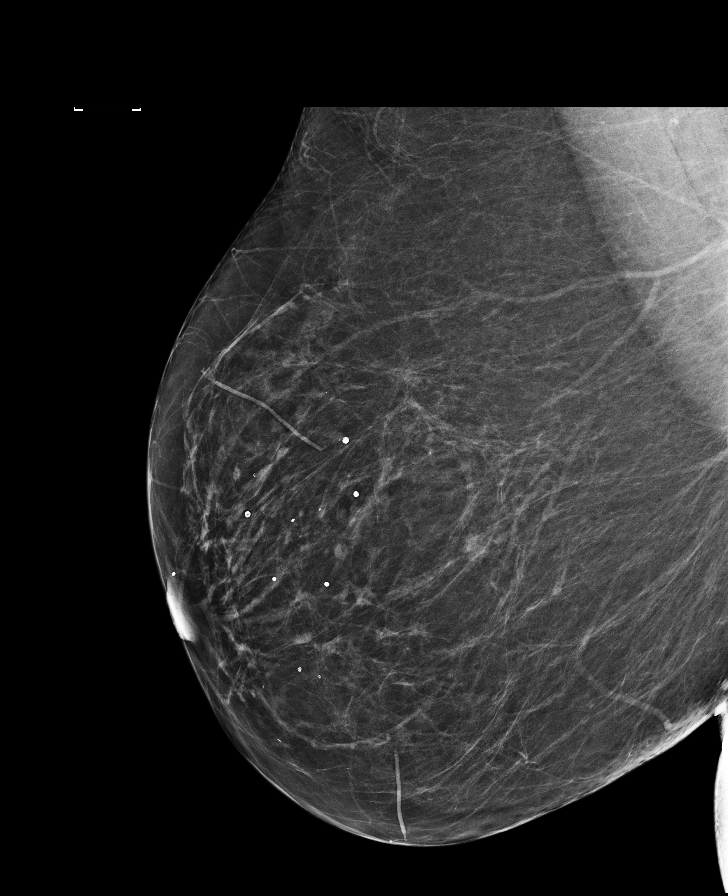

[R MLO synth-2D]
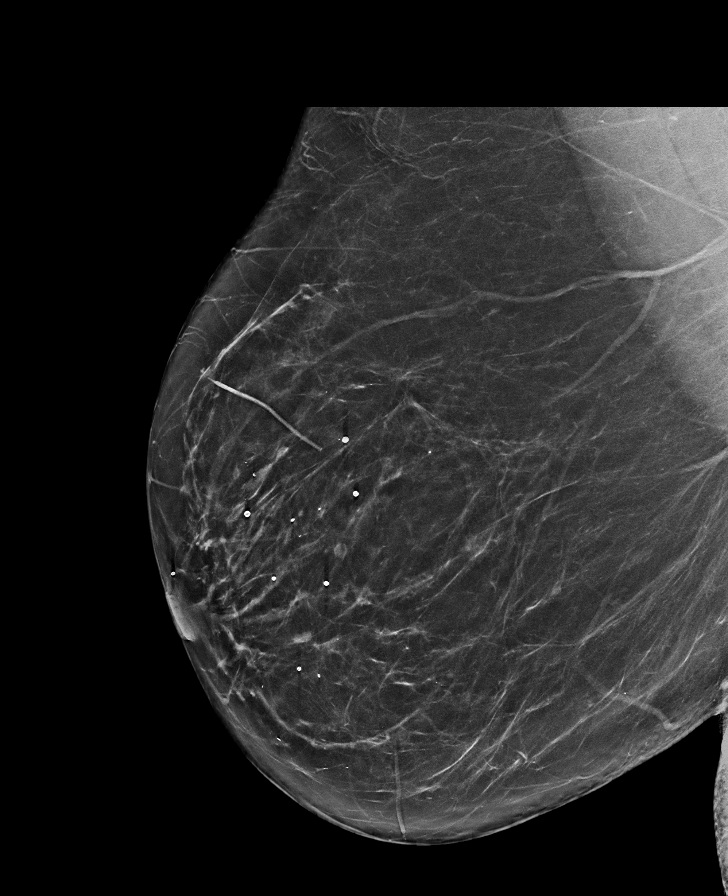

[L CC]
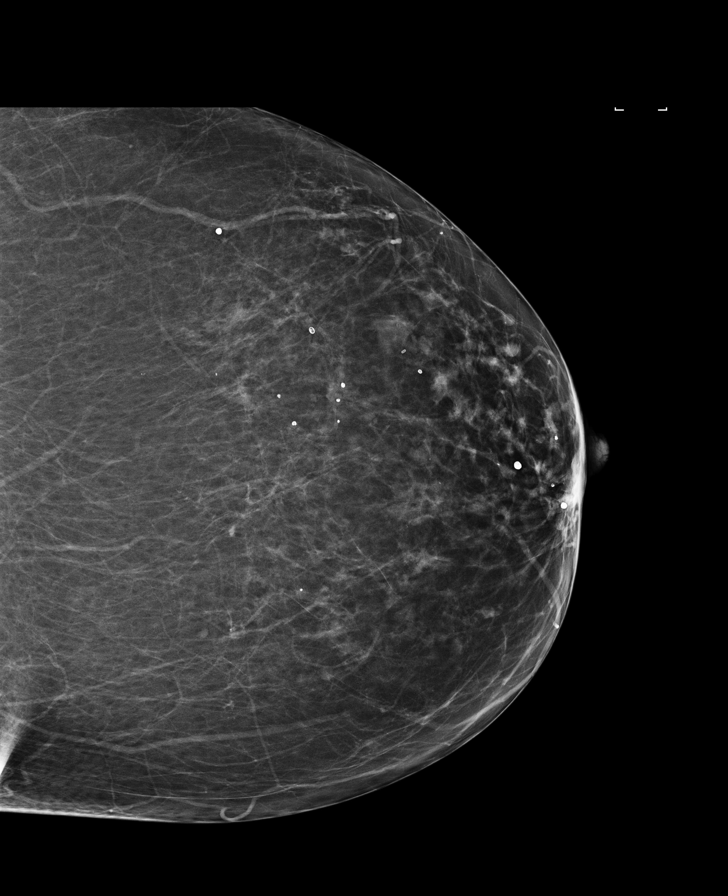

[R CC synth-2D]
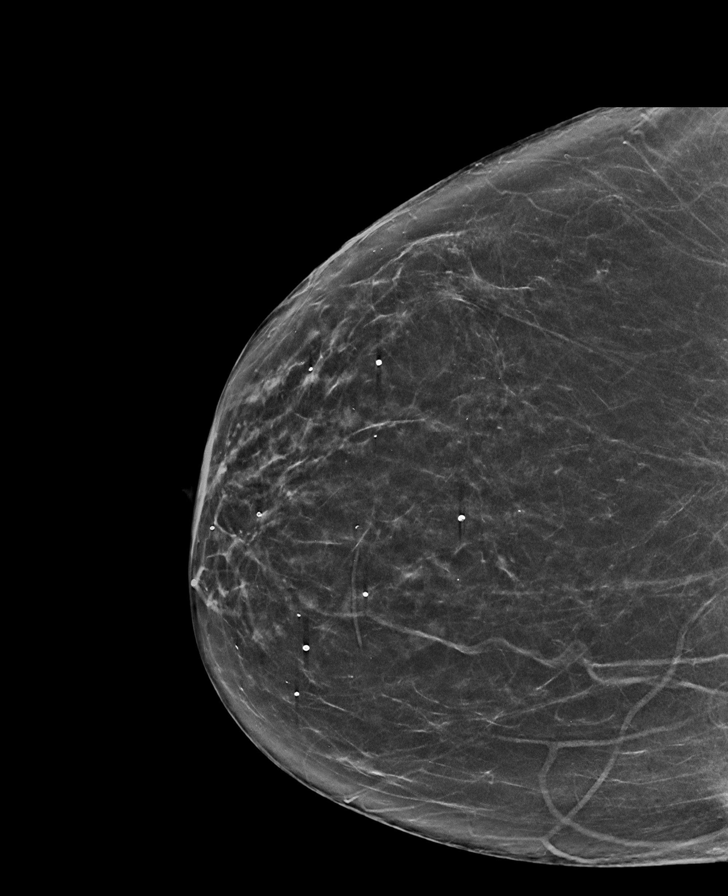

[R CC]
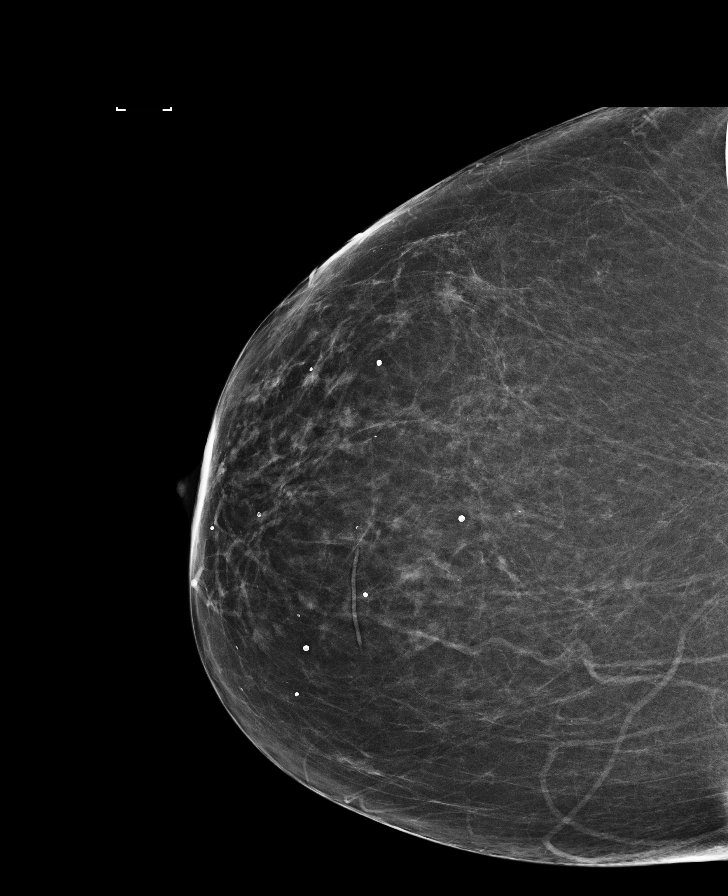

[L CC synth-2D]
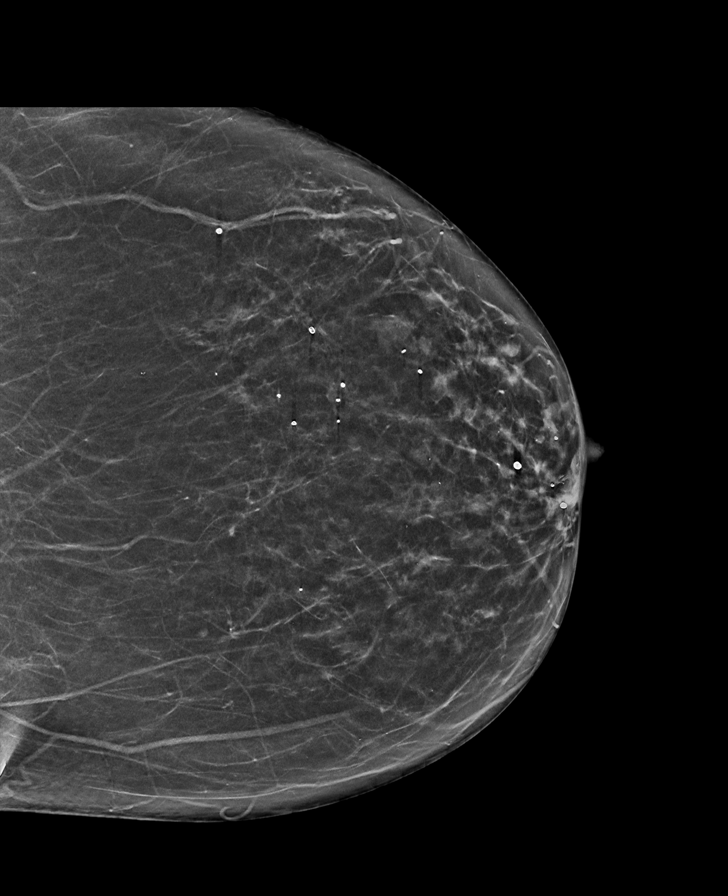

[L MLO]
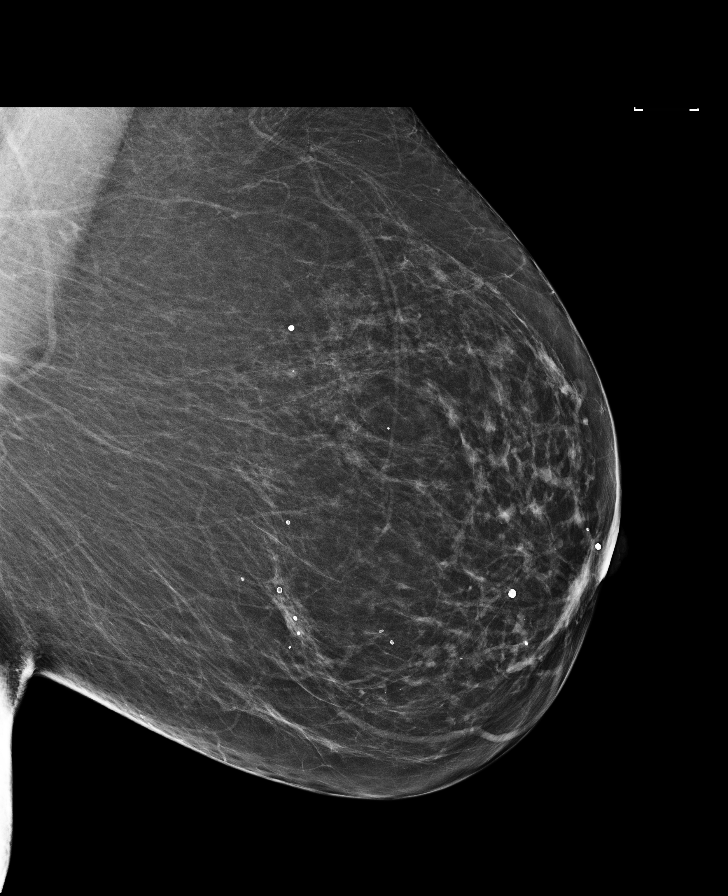

[L MLO synth-2D]
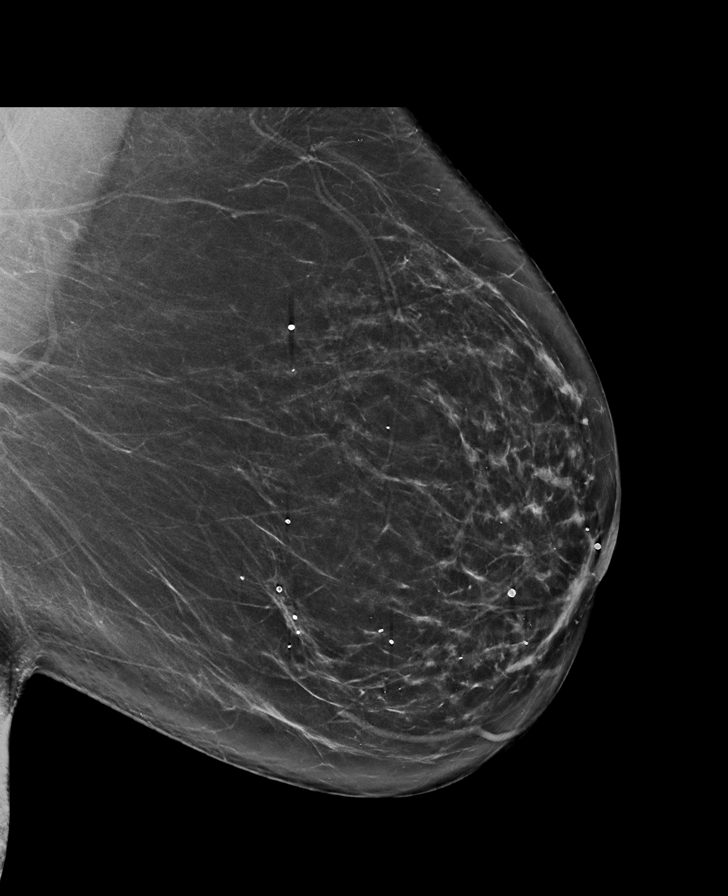

[8 of 28 positions shown; findings below may reference images not displayed]

ACR Breast Density Category b: There are scattered areas of
fibroglandular density.
FINDINGS: There are no findings suspicious for malignancy. Images were
processed with CAD.
IMPRESSION: No mammographic evidence of malignancy. A result letter of this
screening mammogram will be mailed directly to the patient.

RECOMMENDATION:
Screening mammogram in one year. (Code:97-6-RS4)

BI-RADS CATEGORY  1: Negative.

## 2019-02-26 ENCOUNTER — Other Ambulatory Visit: Payer: Self-pay | Admitting: Obstetrics and Gynecology

## 2019-02-26 DIAGNOSIS — Z1231 Encounter for screening mammogram for malignant neoplasm of breast: Secondary | ICD-10-CM

## 2019-04-13 ENCOUNTER — Ambulatory Visit
Admission: RE | Admit: 2019-04-13 | Discharge: 2019-04-13 | Disposition: A | Discharge status: Home / Self Care | Payer: 59 | Source: Ambulatory Visit | Attending: Obstetrics and Gynecology | Admitting: Obstetrics and Gynecology

## 2019-04-13 ENCOUNTER — Other Ambulatory Visit: Payer: Self-pay

## 2019-04-13 DIAGNOSIS — Z1231 Encounter for screening mammogram for malignant neoplasm of breast: Secondary | ICD-10-CM

## 2020-03-28 ENCOUNTER — Other Ambulatory Visit: Payer: Self-pay | Admitting: Obstetrics and Gynecology

## 2020-03-28 DIAGNOSIS — Z1231 Encounter for screening mammogram for malignant neoplasm of breast: Secondary | ICD-10-CM

## 2020-04-26 ENCOUNTER — Other Ambulatory Visit: Payer: Self-pay

## 2020-04-26 ENCOUNTER — Ambulatory Visit
Admission: RE | Admit: 2020-04-26 | Discharge: 2020-04-26 | Disposition: A | Discharge status: Home / Self Care | Payer: 59 | Source: Ambulatory Visit | Attending: Obstetrics and Gynecology | Admitting: Obstetrics and Gynecology

## 2020-04-26 DIAGNOSIS — Z1231 Encounter for screening mammogram for malignant neoplasm of breast: Secondary | ICD-10-CM

## 2021-06-25 ENCOUNTER — Telehealth: Payer: Self-pay | Admitting: Internal Medicine

## 2021-06-25 NOTE — Telephone Encounter (Signed)
Good morning Dr. Hilarie Fredrickson,  Happy Monday morning to you!  This patient is requesting a transfer of care to you from [facility].  I am sending her records to you for your review.  Please let me know if you approve the transfer.  Thank you.

## 2021-07-04 NOTE — Telephone Encounter (Signed)
Ok to schedule Records from [facility] need to be held for appt. JMP

## 2021-07-19 ENCOUNTER — Other Ambulatory Visit: Payer: Self-pay | Admitting: Obstetrics and Gynecology

## 2021-07-19 DIAGNOSIS — Z1231 Encounter for screening mammogram for malignant neoplasm of breast: Secondary | ICD-10-CM

## 2021-08-02 ENCOUNTER — Ambulatory Visit: Payer: Medicare Other

## 2021-08-22 ENCOUNTER — Ambulatory Visit
Admission: RE | Admit: 2021-08-22 | Discharge: 2021-08-22 | Disposition: A | Discharge status: Home / Self Care | Payer: Medicare Other | Source: Ambulatory Visit | Attending: Obstetrics and Gynecology | Admitting: Obstetrics and Gynecology

## 2021-08-22 DIAGNOSIS — Z1231 Encounter for screening mammogram for malignant neoplasm of breast: Secondary | ICD-10-CM

## 2021-08-24 ENCOUNTER — Encounter: Payer: Self-pay | Admitting: *Deleted

## 2021-08-30 ENCOUNTER — Other Ambulatory Visit: Payer: Medicare Other

## 2021-08-30 ENCOUNTER — Encounter: Payer: Self-pay | Admitting: Internal Medicine

## 2021-08-30 ENCOUNTER — Ambulatory Visit (INDEPENDENT_AMBULATORY_CARE_PROVIDER_SITE_OTHER): Payer: Medicare Other | Admitting: Internal Medicine

## 2021-08-30 ENCOUNTER — Telehealth: Payer: Self-pay

## 2021-08-30 VITALS — BP 182/102 | HR 86 | Ht 64.0 in | Wt 194.4 lb

## 2021-08-30 DIAGNOSIS — K219 Gastro-esophageal reflux disease without esophagitis: Secondary | ICD-10-CM | POA: Diagnosis not present

## 2021-08-30 DIAGNOSIS — Z86010 Personal history of colonic polyps: Secondary | ICD-10-CM | POA: Diagnosis not present

## 2021-08-30 DIAGNOSIS — K529 Noninfective gastroenteritis and colitis, unspecified: Secondary | ICD-10-CM

## 2021-08-30 NOTE — Telephone Encounter (Signed)
Recall placed

## 2021-08-30 NOTE — Progress Notes (Signed)
Patient ID: Jean Young, female   DOB: 1955/10/05, 66 y.o.   MRN: [id number] HPI: [name] is a 66 year old female with a past medical history of chronic diarrhea, adenomatous colon polyps, small hiatal hernia, remote cholecystectomy, hypothyroidism, hypertension who is here to discuss chronic diarrhea.  She is here alone today.  Previously her GI care had been with Dr. Matthias Hughs prior to his retirement. I have records which are mostly complete.  She had an upper endoscopy and colonoscopy in December 2020. EGD revealed a 1 cm hiatal hernia.  Otherwise normal.  Duodenal biopsies were negative for celiac disease.  It did show a chronic peptic duodenitis without dysplasia. Colonoscopy for which I have pathology results but not endoscopic report revealed tubular adenoma in the proximal ascending colon, polypoid lymphoid aggregate in the cecum, and random biopsies normal negative for microscopic colitis, active colitis or dysplasia.  She reports that she has severe life altering diarrhea.  This is daily and she has fecal seepage and leakage.  She has to wear a pad on a daily basis.  She feels disabled and is scared to travel.  Diarrhea is worse after eating.  It is never at night.  Seems to have worsened over the last 9 to 12 months but has been present for years.  Her gallbladder was removed in 2002.  No blood in stool or melena.  No abdominal pain with this diarrhea.  No upper GI or hepatobiliary complaint.  No new medications.  Stools at times are oily and fatty..  Stools never formed and never constipation.  She has been on a stable dose of bupropion, Nexium 40 mg longstanding but now only using as needed, lamotrigine which is used for depression and not for seizure disorder.  Humira was started in June by Dr. Nickola Major her rheumatologist for seronegative rheumatoid arthritis.  She has previously tried cholestyramine with no benefit.  Past Medical History:  Diagnosis Date   Depression     GERD (gastroesophageal reflux disease)    Hiatal hernia    History of colonic polyps    Hypertension    Hypothyroidism    IBS (irritable bowel syndrome)    Rosacea    Dr Danella Deis   Sleep apnea    surgery corrected   Thyroid disease    hypothyroidism   Tubular adenoma of colon    Vitamin D deficiency     Past Surgical History:  Procedure Laterality Date   BREAST EXCISIONAL BIOPSY Right    BREAST LUMPECTOMY Right    benign x2   BUNIONECTOMY Bilateral    CHOLECYSTECTOMY     ESI  2012-13   X3; Dr Harrold Donath SURGERY  2004   ligament repaired, right pointer finger   LUMBAR LAMINECTOMY/DECOMPRESSION MICRODISCECTOMY Left 12/11/2012   Procedure: LUMBAR LAMINECTOMY/DECOMPRESSION MICRODISCECTOMY 1 LEVEL;  Surgeon: Maeola Harman, MD;  Location: [facility];  Service: Neurosurgery;  Laterality: Left;  Left L5-S1 Foraminotomy   RHINOPLASTY  2000   ROTATOR CUFF REPAIR  06/2017   THORACIC LAMINECTOMY  03/2018   uvulectomy  &septoplasty  2000   for sleep apnea; Dr Pollyann Kennedy, ENT   VULVA SURGERY      Outpatient Medications Prior to Visit  Medication Sig Dispense Refill   Adalimumab (HUMIRA) 40 MG/0.8ML PSKT Humira 40 mg/0.8 mL subcutaneous syringe kit     buPROPion (WELLBUTRIN XL) 150 MG 24 hr tablet Take 150 mg by mouth daily. Takes with 300mg  tablet to equal dose of 450mg .  buPROPion (WELLBUTRIN XL) 300 MG 24 hr tablet Take 300 mg by mouth daily. Takes with 150mg  tablet to equal dose of 450mg .     butalbital-aspirin-caffeine (FIORINAL) 50-325-40 MG capsule 1 capsule as needed     esomeprazole (NEXIUM) 40 MG capsule Take 40 mg by mouth daily before breakfast.     lamoTRIgine (LAMICTAL) 200 MG tablet Take 200 mg by mouth every morning.     levothyroxine (SYNTHROID, LEVOTHROID) 50 MCG tablet TAKE 1 TABLET BY MOUTH EVERY DAY 102 tablet 0   losartan-hydrochlorothiazide (HYZAAR) 100-12.5 MG per tablet TAKE 1 TABLET BY MOUTH EVERY DAY 30 tablet 0   meloxicam (MOBIC) 15 MG tablet Take 15 mg  by mouth daily.     Respiratory Therapy Supplies (CARETOUCH 2 CPAP HOSE HANGER) MISC      Calcium Carbonate-Vitamin D (CALCIUM + D PO) Take 1 tablet by mouth daily.      Cholecalciferol (VITAMIN D) 2000 UNITS tablet Take 2,000 Units by mouth daily.     HYDROcodone-acetaminophen (NORCO) 5-325 MG tablet Take 1 tablet by mouth every 4 (four) hours as needed for moderate pain. 8 tablet 0   levothyroxine (SYNTHROID, LEVOTHROID) 50 MCG tablet Take 1 tablet (50 mcg total) by mouth daily before breakfast. 30 tablet 0   losartan-hydrochlorothiazide (HYZAAR) 100-12.5 MG per tablet TAKE 1 TABLET BY MOUTH EVERY DAY 90 tablet 1   losartan-hydrochlorothiazide (HYZAAR) 100-12.5 MG per tablet Take 1 tablet by mouth daily. 30 tablet 2   methocarbamol (ROBAXIN) 500 MG tablet Take 1 tablet (500 mg total) by mouth every 6 (six) hours as needed (spasms). 50 tablet 1   norethindrone-ethinyl estradiol (FEMHRT LOW DOSE) 0.5-2.5 MG-MCG per tablet Take 1 tablet by mouth daily.     oxyCODONE-acetaminophen (PERCOCET/ROXICET) 5-325 MG per tablet Take 1-2 tablets by mouth every 4 (four) hours as needed. 51 tablet 0   Polyethyl Glycol-Propyl Glycol (SYSTANE) 0.4-0.3 % SOLN Apply 1 drop to eye 2 (two) times daily as needed (for dry eyes).      Probiotic Product (ALIGN PO) Take 1 tablet by mouth daily.     No facility-administered medications prior to visit.    Allergies  Allergen Reactions   Sulfa Antibiotics Other (See Comments) and Rash   Pregabalin Other (See Comments)    REACTION: Affected memory    Family History  Problem Relation Age of Onset   Hypertension Mother    Hypothyroidism Mother    Colon polyps Mother    Stroke Father        CVA post severe burns   Cerebral palsy Sister    Colon polyps Sister    Dementia Maternal Grandmother    Cancer Maternal Grandfather        LEUKEMIA   Cancer Paternal Grandmother        GI   Cancer Paternal Grandfather        LIVER ??   Breast cancer Paternal Aunt     Alcohol abuse Other        & addiction..paternal family   Diabetes Neg Hx    Colon cancer Neg Hx    Liver disease Neg Hx     Social History   Tobacco Use   Smoking status: Never   Smokeless tobacco: Never  Substance Use Topics   Alcohol use: No   Drug use: No    ROS: As per history of present illness, otherwise negative  BP (!) 182/102 (BP Location: Left Arm, Patient Position: Sitting, Cuff Size: Normal)   Pulse 86  Ht 5\' 4"  (1.626 m)   Wt 194 lb 6 oz (88.2 kg)   SpO2 97%   BMI 33.36 kg/m  Gen: awake, alert, NAD HEENT: anicteric  CV: RRR, no mrg Pulm: CTA b/l Abd: soft, obese, NT/ND, +BS throughout Ext: no c/c/e Neuro: nonfocal   ASSESSMENT/PLAN:  66 year old female with a past medical history of chronic diarrhea, adenomatous colon polyps, small hiatal hernia, remote cholecystectomy, hypothyroidism, hypertension who is here to discuss chronic diarrhea.   Chronic diarrhea --severe symptoms which limit her function.  Differential includes exocrine pancreatic insufficiency, bile salt diarrhea, IBS-D.  Celiac disease and microscopic colitis have been previously excluded.  We discussed her symptoms at length today and I have recommended stool testing, followed by trial of pancreatic enzyme supplementation.  We did discuss if no response I would consider Viberzi or Lotronex.  We discussed the risk of pancreatitis with Viberzi particularly in postcholecystectomy patients as well as the risk of severe constipation and ischemic colitis with Lotronex is used.  She states that she would be willing to accept the risk given her severe symptoms and the significant potential these medications would improve her quality of life.  We will discuss this again before prescribing such medicines.  Plan as follows: --Fecal elastase and calprotectin (particularly in light of seronegative RA; IBD possible but unlikely) --Once stool studies submitted trial of Zenpep 40,000 units; 2 with meals 1 with  snacks.  I gave her samples which should last 10 to 14 days; I asked that she send me a MyChart email in about 10 days to let me know if symptoms have significantly changed. --If not I would recommend we retry colestipol --If no benefit from Zenpep or colestipol then I would consider trial of Lotronex or Viberzi; probably starting with Lotronex  2.  History of adenomatous colon polyp --surveillance colonoscopy December 2025  3.  GERD --she is using as needed Nexium 40 mg, no alarm symptoms or history of Barrett's esophagus

## 2021-08-30 NOTE — Telephone Encounter (Signed)
-----   Message from Jerene Bears, MD sent at 08/30/2021 12:50 PM EDT ----- ?Please place recall for colonoscopy - personal hx colon polyps - Dec 2025 ?Thanks ? ?

## 2021-08-30 NOTE — Patient Instructions (Signed)
Your provider has requested that you go to the basement level for lab work before leaving today. Press "B" on the elevator. The lab is located at the first door on the left as you exit the elevator. ? ?Start Zenpep after you have submitted stool studies. Send my chart message in 10 days letting Dr.Pyrtle know how your doing. ? ?Medication Samples have been provided to the patient. ? ?Drug name: Zenpep       Strength: 40,'000mg'$       ? ?Dosing instructions: Take 2 capsules with each meal and 1 capsules with snacks( up too 2 snacks per day.) ? ?If you are age 50 or older, your body mass index should be between 23-30. Your Body mass index is 33.36 kg/m?Marland Kitchen If this is out of the aforementioned range listed, please consider follow up with your Primary Care Provider. ? ?If you are age 27 or younger, your body mass index should be between 19-25. Your Body mass index is 33.36 kg/m?Marland Kitchen If this is out of the aformentioned range listed, please consider follow up with your Primary Care Provider.  ? ?________________________________________________________ ? ?The [place] [facility] providers would like to encourage you to use [facility] to communicate with providers for non-urgent requests or questions.  Due to long hold times on the telephone, sending your provider a message by [facility] may be a faster and more efficient way to get a response.  Please allow 48 business hours for a response.  Please remember that this is for non-urgent requests.  ? ?Thank you for choosing me and [facility]. ? ? ? ? ?

## 2021-08-31 ENCOUNTER — Other Ambulatory Visit: Payer: Medicare Other

## 2021-08-31 DIAGNOSIS — K529 Noninfective gastroenteritis and colitis, unspecified: Secondary | ICD-10-CM

## 2021-09-05 LAB — CALPROTECTIN, FECAL: Calprotectin, Fecal: 50 ug/g (ref 0–120)

## 2021-09-07 ENCOUNTER — Telehealth: Payer: Self-pay | Admitting: Internal Medicine

## 2021-09-07 LAB — PANCREATIC ELASTASE, FECAL: Pancreatic Elastase-1, Stool: >500 mcg/g

## 2021-09-07 NOTE — Telephone Encounter (Signed)
Patient is returning your call regarding results °

## 2021-09-07 NOTE — Telephone Encounter (Signed)
See lab result note from 09/07/21 ?

## 2021-09-07 NOTE — Telephone Encounter (Signed)
See lab note from 09/07/21 ?

## 2021-09-07 NOTE — Telephone Encounter (Signed)
Patient is calling back to follow up on results.  ?

## 2021-09-11 ENCOUNTER — Other Ambulatory Visit: Payer: Self-pay | Admitting: Internal Medicine

## 2021-09-11 MED ORDER — ZENPEP 40000-126000 UNITS PO CPEP: ORAL_CAPSULE | ORAL | 1 refills | Status: DC

## 2021-09-17 ENCOUNTER — Telehealth: Payer: Self-pay | Admitting: Internal Medicine

## 2021-09-17 NOTE — Telephone Encounter (Signed)
Inbound call from patient would like a call back to discuss medication. Would not elaborate which one  ?

## 2021-09-17 NOTE — Telephone Encounter (Signed)
See if Creon 36K units, 2 with meals and 1 with snacks would be covered for her ?

## 2021-09-17 NOTE — Telephone Encounter (Signed)
Returned phone call to patient. She was calling to discuss Zenpep. She advised that the coupon did not work since her insurance will not cover it, and [employer] only discounts it to $1600. She also advised that her stools have been semi-formed, then become diarrhea. She has 1 day left of the samples that she was given. Please advise. ?

## 2021-09-18 MED ORDER — PANCRELIPASE (LIP-PROT-AMYL) 36000-114000 UNITS PO CPEP
ORAL_CAPSULE | ORAL | 0 refills | Status: DC
Start: 2021-09-18 — End: 2021-11-29

## 2021-09-18 NOTE — Telephone Encounter (Signed)
I have scheduled a follow up appointment for patient in June  2023. She verbalizes understanding and is in agreement with the plan. ?

## 2021-09-18 NOTE — Telephone Encounter (Signed)
June

## 2021-09-18 NOTE — Telephone Encounter (Signed)
I have spoken to patient to advise that we will try Creon instead of zenpep to see if that will be covered by her insurance better. Patient verbalizes understanding. Rx sent. ? ?Patient would like to inquire about when Dr Hilarie Fredrickson would like to see her again in follow up. ?

## 2021-11-27 ENCOUNTER — Ambulatory Visit: Payer: Medicare Other | Admitting: Internal Medicine

## 2021-11-29 ENCOUNTER — Ambulatory Visit (INDEPENDENT_AMBULATORY_CARE_PROVIDER_SITE_OTHER): Payer: Medicare Other | Admitting: Internal Medicine

## 2021-11-29 ENCOUNTER — Encounter: Payer: Self-pay | Admitting: Internal Medicine

## 2021-11-29 VITALS — BP 124/80 | HR 71 | Ht 64.0 in | Wt 189.0 lb

## 2021-11-29 DIAGNOSIS — K529 Noninfective gastroenteritis and colitis, unspecified: Secondary | ICD-10-CM

## 2021-11-29 DIAGNOSIS — K9089 Other intestinal malabsorption: Secondary | ICD-10-CM | POA: Diagnosis not present

## 2021-11-29 MED ORDER — CHOLESTYRAMINE 4 G PO PACK: 4.0000 g | PACK | Freq: Two times a day (BID) | ORAL | 3 refills | Status: DC

## 2021-11-29 NOTE — Progress Notes (Signed)
   Subjective:    Patient ID: Jean Young, female    DOB: August 06, 1955, 66 y.o.   MRN: [id number]  HPI Jean Young is a 66 year old female with a past medical history of chronic diarrhea, adenomatous colon polyps, small hiatal hernia, remote cholecystectomy, hypothyroidism, hypertension who is here to discuss chronic diarrhea.  She is here alone today.  After her last visit we tried pancreatic enzyme replacement with Creon and Zenpep.  She tried both because of issues with insurance and approval of this medication.  She took this for weeks without any benefit.  She stopped it.  She found a previous prescription for cholestyramine 4 g 1-2 times a day that Dr. Cristina Gong had previously given her.  She started this on Saturday, 5 days ago.  She took twice daily Saturday and then once daily since then.  She has had absolutely no diarrhea since she started this medication.  She feels incredibly happy because she had felt trapped for so long with diarrhea on a daily basis.  She still having bowel movements now which are formed.  No abdominal pain.   Review of Systems As per HPI, otherwise negative  Current Medications, Allergies, Past Medical History, Past Surgical History, Family History and Social History were reviewed in [employer] record.    Objective:   Physical Exam BP 124/80   Pulse 71   Ht '5\' 4"'$  (1.626 m)   Wt 189 lb (85.7 kg)   BMI 32.44 kg/m  Gen: awake, alert, NAD HEENT: anicteric Neuro: nonfocal  Fecal calprotectin 50 --normal Fecal elastase greater than 500       Assessment & Plan:  66 year old female with a past medical history of chronic diarrhea, adenomatous colon polyps, small hiatal hernia, remote cholecystectomy, hypothyroidism, hypertension who is here to discuss chronic diarrhea.   Chronic diarrhea/bile salt diarrhea --no response to pancreatic enzyme replacement.  This fits given her fecal calprotectin was normal.  No inflammation in  the stool.  Excellent response to bile acid sequestrant.  I reassured her that this effect should continue. --Cholestyramine 4 g 1-2 times daily; she is aware to separate this from other medications --She can use Lomotil 1 tablet 3 times daily as needed as a backup in an emergency situation --She request follow-up in September which is certainly fine --If cholestyramine stopped being effective we would try Lotronex  2.  History of adenomatous polyp --surveillance colonoscopy recommended around December 2025  3.  GERD --stable on Nexium 40 mg daily  20 minutes total spent today including patient facing time, coordination of care, reviewing medical history/procedures/pertinent radiology studies, and documentation of the encounter.

## 2021-11-29 NOTE — Patient Instructions (Signed)
Discontinue Creon  We have sent the following medications to your pharmacy for you to pick up at your convenience: Cholestyramine 4 mg twice daily  Please follow up with Dr Hilarie Fredrickson in 3 months.  If you are age 67 or older, your body mass index should be between 23-30. Your Body mass index is 32.44 kg/m. If this is out of the aforementioned range listed, please consider follow up with your Primary Care Provider.  If you are age 44 or younger, your body mass index should be between 19-25. Your Body mass index is 32.44 kg/m. If this is out of the aformentioned range listed, please consider follow up with your Primary Care Provider.   ________________________________________________________  The [place] [facility] providers would like to encourage you to use [facility] to communicate with providers for non-urgent requests or questions.  Due to long hold times on the telephone, sending your provider a message by [facility] may be a faster and more efficient way to get a response.  Please allow 48 business hours for a response.  Please remember that this is for non-urgent requests.  _______________________________________________________ Due to recent changes in healthcare laws, you may see the results of your imaging and laboratory studies on MyChart before your provider has had a chance to review them.  We understand that in some cases there may be results that are confusing or concerning to you. Not all laboratory results come back in the same time frame and the provider may be waiting for multiple results in order to interpret others.  Please give [place] 48 hours in order for your provider to thoroughly review all the results before contacting the office for clarification of your results.

## 2021-12-15 HISTORY — PX: LAMINECTOMY THORACIC SPINE W/ PLACEMENT SPINAL CORD STIMULATOR: SHX1917

## 2022-04-05 ENCOUNTER — Other Ambulatory Visit: Payer: Self-pay | Admitting: Internal Medicine

## 2022-05-15 ENCOUNTER — Other Ambulatory Visit: Payer: Self-pay | Admitting: Internal Medicine

## 2022-05-16 NOTE — Telephone Encounter (Signed)
Please make an appointment for further refills.

## 2022-07-19 ENCOUNTER — Other Ambulatory Visit: Payer: Self-pay | Admitting: Obstetrics and Gynecology

## 2022-07-19 DIAGNOSIS — Z1231 Encounter for screening mammogram for malignant neoplasm of breast: Secondary | ICD-10-CM

## 2022-09-09 ENCOUNTER — Ambulatory Visit
Admission: RE | Admit: 2022-09-09 | Discharge: 2022-09-09 | Disposition: A | Discharge status: Home / Self Care | Payer: Medicare Other | Source: Ambulatory Visit | Attending: Obstetrics and Gynecology | Admitting: Obstetrics and Gynecology

## 2022-09-09 DIAGNOSIS — Z1231 Encounter for screening mammogram for malignant neoplasm of breast: Secondary | ICD-10-CM

## 2022-09-16 ENCOUNTER — Other Ambulatory Visit: Payer: Self-pay | Admitting: Obstetrics and Gynecology

## 2022-09-16 DIAGNOSIS — N6459 Other signs and symptoms in breast: Secondary | ICD-10-CM

## 2022-09-18 ENCOUNTER — Other Ambulatory Visit: Payer: Medicare Other

## 2022-09-30 ENCOUNTER — Ambulatory Visit
Admission: RE | Admit: 2022-09-30 | Discharge: 2022-09-30 | Disposition: A | Discharge status: Home / Self Care | Payer: Medicare Other | Source: Ambulatory Visit | Attending: Obstetrics and Gynecology | Admitting: Obstetrics and Gynecology

## 2022-09-30 DIAGNOSIS — N6459 Other signs and symptoms in breast: Secondary | ICD-10-CM

## 2022-10-11 ENCOUNTER — Other Ambulatory Visit: Payer: Self-pay | Admitting: Obstetrics and Gynecology

## 2022-10-11 DIAGNOSIS — N6459 Other signs and symptoms in breast: Secondary | ICD-10-CM

## 2022-11-15 ENCOUNTER — Ambulatory Visit
Admission: RE | Admit: 2022-11-15 | Discharge: 2022-11-15 | Disposition: A | Discharge status: Home / Self Care | Payer: Medicare Other | Source: Ambulatory Visit | Attending: Obstetrics and Gynecology | Admitting: Obstetrics and Gynecology

## 2022-11-15 DIAGNOSIS — N6459 Other signs and symptoms in breast: Secondary | ICD-10-CM

## 2022-11-15 MED ORDER — GADOPICLENOL 0.5 MMOL/ML IV SOLN
9.0000 mL | Freq: Once | INTRAVENOUS | Status: AC | PRN
  Administered 2022-11-15: 9 mL via INTRAVENOUS

## 2022-11-20 ENCOUNTER — Other Ambulatory Visit: Payer: Self-pay | Admitting: Internal Medicine

## 2022-11-21 ENCOUNTER — Telehealth: Payer: Self-pay | Admitting: Internal Medicine

## 2022-11-21 MED ORDER — CHOLESTYRAMINE 4 G PO PACK: PACK | ORAL | 0 refills | Status: DC

## 2022-11-21 NOTE — Telephone Encounter (Signed)
Patient calling to make an appointment for 10/23 due to her Cholestyramine Rx being denied requesting to have it filled to hold her off until appointment. Please advise

## 2022-11-21 NOTE — Telephone Encounter (Signed)
Prescription for cholestyramine sent to patient's pharmacy until scheduled appt to see Dr. Rhea Belton.

## 2023-04-09 ENCOUNTER — Ambulatory Visit: Payer: Medicare Other | Admitting: Internal Medicine

## 2023-04-09 ENCOUNTER — Encounter: Payer: Self-pay | Admitting: Internal Medicine

## 2023-04-09 VITALS — BP 128/80 | HR 80 | Ht 62.25 in | Wt 189.2 lb

## 2023-04-09 DIAGNOSIS — Z860100 Personal history of colon polyps, unspecified: Secondary | ICD-10-CM

## 2023-04-09 DIAGNOSIS — K219 Gastro-esophageal reflux disease without esophagitis: Secondary | ICD-10-CM

## 2023-04-09 DIAGNOSIS — K9089 Other intestinal malabsorption: Secondary | ICD-10-CM | POA: Diagnosis not present

## 2023-04-09 MED ORDER — ESOMEPRAZOLE MAGNESIUM 40 MG PO CPDR: 40.0000 mg | DELAYED_RELEASE_CAPSULE | Freq: Every day | ORAL | 11 refills | Status: DC

## 2023-04-09 MED ORDER — COLESTIPOL HCL 1 G PO TABS: ORAL_TABLET | ORAL | 5 refills | Status: DC

## 2023-04-09 NOTE — Progress Notes (Signed)
   Subjective:    Patient ID: Jean Young, female    DOB: 1955-07-20, 67 y.o.   MRN: [id number]  HPI Delanna Jobst is a 67 year old female with a history of chronic diarrhea/bile salt diarrhea, adenomatous colon polyps, small hiatal hernia, remote cholecystectomy, hypothyroidism, hypertension, seronegative RA on Humira, sleep apnea who is here for follow-up.  She was last seen in June 2023.  From a diarrhea perspective she has been doing very well on her cholestyramine.  She does have a difficult time with the consistency and taste of this medication but taking it with applesauce has made it somewhat easier.  She takes 4 g daily and 4 g twice daily on most days.  This has made her life much better and much less confined to her home due to diarrhea.  She is only had 2 or 3 episodes of loose stools with accident since her last visit 15 months ago.  She is having bowel movements that are mostly formed.  Occasionally she notices some mild fecal smearing.  No blood in stool or melena.  Reflux is well-controlled on Nexium daily.  She has been treated several times for abscesses at her elbow requiring incision and drainage and IV antibiotic.  She is also had a spinal stimulator placed since her last visit with me.   Review of Systems As per HPI, otherwise negative  Current Medications, Allergies, Past Medical History, Past Surgical History, Family History and Social History were reviewed in [employer] record.    Objective:   Physical Exam BP 128/80   Pulse 80   Ht 5' 2.25" (1.581 m) Comment: height measured without shoes  Wt 189 lb 4 oz (85.8 kg)   BMI 34.34 kg/m  Gen: awake, alert, NAD HEENT: anicteric  Ext: no c/c/e Neuro: nonfocal      Assessment & Plan:  67 year old female with a history of chronic diarrhea/bile salt diarrhea, adenomatous colon polyps, small hiatal hernia, remote cholecystectomy, hypothyroidism, hypertension, seronegative RA on Humira,  sleep apnea who is here for follow-up.   Bile salt diarrhea --excellent response to bile acid sequestrant.  Due to consistency and difficulty taking the powdered cholestyramine we will try her on colestipol.  She can titrate this dose but if it is ineffective she will return to cholestyramine -- Change cholestyramine to colestipol 2 to 4 g twice daily -- Lomotil could still be used 1 tablet 3 times daily as needed as a backup in an emergency situation with loose stools or diarrhea -- Annual follow-up  2.  GERD --stable without alarm symptom, continue Nexium 40 mg daily  3.  History of adenomatous colon polyp --surveillance colonoscopy recommended next year, December 2025  30 minutes total spent today including patient facing time, coordination of care, reviewing medical history/procedures/pertinent radiology studies, and documentation of the encounter.

## 2023-04-09 NOTE — Patient Instructions (Signed)
We have sent the following medications to your pharmacy for you to pick up at your convenience: colestipol and Nexium.  _______________________________________________________  If your blood pressure at your visit was 140/90 or greater, please contact your primary care physician to follow up on this.  _______________________________________________________  If you are age 68 or older, your body mass index should be between 23-30. Your Body mass index is 34.34 kg/m. If this is out of the aforementioned range listed, please consider follow up with your Primary Care Provider.  If you are age 2 or younger, your body mass index should be between 19-25. Your Body mass index is 34.34 kg/m. If this is out of the aformentioned range listed, please consider follow up with your Primary Care Provider.   ________________________________________________________  The [place] [facility] providers would like to encourage you to use [facility] to communicate with providers for non-urgent requests or questions.  Due to long hold times on the telephone, sending your provider a message by [facility] may be a faster and more efficient way to get a response.  Please allow 48 business hours for a response.  Please remember that this is for non-urgent requests.  _______________________________________________________

## 2024-01-16 HISTORY — PX: CERVICAL DISC SURGERY: SHX588

## 2024-03-10 ENCOUNTER — Other Ambulatory Visit: Payer: Self-pay | Admitting: Internal Medicine

## 2024-06-22 NOTE — Progress Notes (Unsigned)
 "     Ellouise Console, PA-C [address] [place], [place]  [address] Phone: [phone]   Primary Care Physician: Beam, Lamar POUR, MD  Primary Gastroenterologist:  Ellouise Console, PA-C / Dr. Gordy Starch   Chief Complaint: Follow-up bile salt diarrhea       HPI:   Discussed the use of AI scribe software for clinical note transcription with the patient, who gave verbal consent to proceed.  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  This treatment works well and she needs refill.  History of GERD which is controlled on Nexium  daily.  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  She is currently due for repeat surveillance colonoscopy.  History of Present Illness      Current Outpatient Medications  Medication Sig Dispense Refill   acetaminophen  (TYLENOL ) 500 MG tablet Take 500 mg by mouth as needed.     Adalimumab (HUMIRA) 40 MG/0.8ML PSKT Humira 40 mg/0.8 mL subcutaneous syringe kit     ALPRAZolam (XANAX) 1 MG tablet Take 1 mg by mouth 2 (two) times daily as needed.     buPROPion  (WELLBUTRIN  XL) 150 MG 24 hr tablet Take 150 mg by mouth daily. Takes with 300mg  tablet to equal dose of 450mg .     buPROPion  (WELLBUTRIN  XL) 300 MG 24 hr tablet Take 300 mg by mouth daily. Takes with 150mg  tablet to equal dose of 450mg .     butalbital-aspirin-caffeine (FIORINAL) 50-325-40 MG capsule 1 capsule as needed     colestipol  (COLESTID ) 1 g tablet Take 2-4 grams (2-4 tablets) by mouth twice daily 240 tablet 5   esomeprazole  (NEXIUM ) 40 MG capsule Take 1 capsule (40 mg total) by mouth daily before breakfast. 30 capsule 11   gabapentin (NEURONTIN) 300 MG capsule Take 300 mg by mouth as needed.     hydrocortisone 2.5 % ointment Apply 1 Application topically as needed.     KLOR-CON M20 20 MEQ  tablet Take 1 tablet by mouth daily.     lamoTRIgine  (LAMICTAL ) 200 MG tablet Take 200 mg by mouth every morning.     levothyroxine  (SYNTHROID ) 75 MCG tablet Take 1 tablet by mouth daily.     loratadine-pseudoephedrine (CLARITIN-D 24 HOUR) 10-240 MG 24 hr tablet Take 1 tablet by mouth daily as needed.     losartan -hydrochlorothiazide  (HYZAAR) 100-12.5 MG per tablet TAKE 1 TABLET BY MOUTH EVERY DAY 30 tablet 0   loteprednol (LOTEMAX) 0.5 % ophthalmic suspension Place 1 drop into both eyes as needed.     meloxicam (MOBIC) 15 MG tablet Take 15 mg by mouth daily.     metoprolol succinate (TOPROL-XL) 25 MG 24 hr tablet Take 25 mg by mouth daily.     Respiratory Therapy Supplies (CARETOUCH 2 CPAP HOSE HANGER) MISC      Vitamin D , Ergocalciferol , (DRISDOL) 1.25 MG (50000 UNIT) CAPS capsule Take 50,000 Units by mouth once a week.     No current facility-administered medications for this visit.    Allergies as of 06/23/2024 - Review Complete 04/09/2023  Allergen Reaction Noted   Methotrexate Nausea Only 02/07/2021   Minocycline Hives 01/19/2018   Sulfa antibiotics Other (See Comments) and Rash 09/14/2019   Lyrica [pregabalin]  04/09/2023   Pregabalin Other (See Comments) 05/07/2011    Past Medical History:  Diagnosis Date   Depression  GERD (gastroesophageal reflux disease)    Hiatal hernia    History of colonic polyps    Hypertension    Hypothyroidism    IBS (irritable bowel syndrome)    Rosacea    Dr Helga   Sleep apnea    surgery corrected   Thyroid disease    hypothyroidism   Tubular adenoma of colon    Vitamin D  deficiency     Past Surgical History:  Procedure Laterality Date   BREAST EXCISIONAL BIOPSY Right    BREAST LUMPECTOMY Right    benign x2   BUNIONECTOMY Bilateral    CHOLECYSTECTOMY     [facility]  2012-13   X3; Dr Bonner HOME SURGERY  2004   ligament repaired, right pointer finger   LAMINECTOMY THORACIC SPINE W/ PLACEMENT SPINAL CORD STIMULATOR  12/2021    LUMBAR LAMINECTOMY/DECOMPRESSION MICRODISCECTOMY Left 12/11/2012   Procedure: LUMBAR LAMINECTOMY/DECOMPRESSION MICRODISCECTOMY 1 LEVEL;  Surgeon: Fairy Levels, MD;  Location: [facility];  Service: Neurosurgery;  Laterality: Left;  Left L5-S1 Foraminotomy   RHINOPLASTY  2000   ROTATOR CUFF REPAIR  06/2017   THORACIC LAMINECTOMY  03/2018   uvulectomy  &septoplasty  2000   for sleep apnea; Dr Jesus, ENT   VULVA SURGERY      Review of Systems:    All systems reviewed and negative except where noted in HPI.    Physical Exam:  There were no vitals taken for this visit. No LMP recorded. Patient is postmenopausal.  General: Well-nourished, well-developed in no acute distress.  Lungs: Clear to auscultation bilaterally. Non-labored. Heart: Regular rate and rhythm, no murmurs rubs or gallops.  Abdomen: Bowel sounds are normal; Abdomen is Soft; No hepatosplenomegaly, masses or hernias;  No Abdominal Tenderness; No guarding or rebound tenderness. Neuro: Alert and oriented x 3.  Grossly intact.  Psych: Alert and cooperative, normal mood and affect.   Imaging Studies: No results found.  Labs: CBC    Component Value Date/Time   WBC 8.7 12/03/2012 0930   RBC 4.91 12/03/2012 0930   HGB 15.8 (H) 12/03/2012 0930   HCT 45.4 12/03/2012 0930   PLT 278 12/03/2012 0930   MCV 92.5 12/03/2012 0930   MCH 32.2 12/03/2012 0930   MCHC 34.8 12/03/2012 0930   RDW 13.9 12/03/2012 0930   LYMPHSABS 1.9 02/20/2010 0822   MONOABS 0.9 02/20/2010 0822   EOSABS 0.1 02/20/2010 0822   BASOSABS 0.0 02/20/2010 0822    CMP     Component Value Date/Time   NA 135 12/03/2012 1000   K 3.9 12/03/2012 1000   CL 99 12/03/2012 1000   CO2 24 12/03/2012 1000   GLUCOSE 90 12/03/2012 1000   BUN 20 12/03/2012 1000   CREATININE 1.16 (H) 12/03/2012 1000   CALCIUM  9.4 12/03/2012 1000   PROT 6.7 06/29/2012 0945   ALBUMIN 4.1 06/29/2012 0945   AST 14 06/29/2012 0945   ALT 19 06/29/2012 0945   ALKPHOS 73 06/29/2012  0945   BILITOT 0.3 06/29/2012 0945   GFRNONAA 51 (L) 12/03/2012 1000   GFRAA 59 (L) 12/03/2012 1000       Assessment and Plan:   Jean Young is a 69 y.o. y/o female   1.  Bile salt diarrhea postcholecystectomy - Refill cholestyramine  powder or Colestid  tablets?  2.  History of adenomatous colon polyps - Scheduling Colonoscopy I discussed risks of colonoscopy with patient to include risk of bleeding, colon perforation, and risk of sedation.  Patient expressed understanding and agrees to proceed  with colonoscopy.   3.  GERD: Controlled on Nexium  40 mg daily - Continue Nexium  40 mg 1 capsule once daily.  Assessment and Plan Assessment & Plan       Ellouise Console, PA-C  Follow up ***   "

## 2024-06-23 ENCOUNTER — Other Ambulatory Visit: Payer: Self-pay | Admitting: Physician Assistant

## 2024-06-23 ENCOUNTER — Encounter: Payer: Self-pay | Admitting: Physician Assistant

## 2024-06-23 ENCOUNTER — Ambulatory Visit: Admitting: Physician Assistant

## 2024-06-23 VITALS — BP 114/70 | HR 84 | Ht 62.25 in | Wt 199.0 lb

## 2024-06-23 DIAGNOSIS — K9089 Other intestinal malabsorption: Secondary | ICD-10-CM | POA: Diagnosis not present

## 2024-06-23 DIAGNOSIS — K219 Gastro-esophageal reflux disease without esophagitis: Secondary | ICD-10-CM | POA: Diagnosis not present

## 2024-06-23 DIAGNOSIS — Z860100 Personal history of colon polyps, unspecified: Secondary | ICD-10-CM

## 2024-06-23 DIAGNOSIS — Z860101 Personal history of adenomatous and serrated colon polyps: Secondary | ICD-10-CM | POA: Diagnosis not present

## 2024-06-23 MED ORDER — ESOMEPRAZOLE MAGNESIUM 40 MG PO CPDR: 40.0000 mg | DELAYED_RELEASE_CAPSULE | Freq: Every day | ORAL | 3 refills | Status: AC

## 2024-06-23 MED ORDER — COLESTIPOL HCL 1 G PO TABS: 3.0000 g | ORAL_TABLET | Freq: Two times a day (BID) | ORAL | 3 refills | Status: DC

## 2024-06-23 NOTE — Patient Instructions (Addendum)
 Please continue you Nexium  40 mg once daily and Colestipol  3 capsules twice daily  Please give us  a call when you are ready to schedule your Colonoscopy.   If you use inhalers (even only as needed), please bring them with you on the day of your procedure.  DO NOT TAKE 7 DAYS PRIOR TO TEST- Trulicity (dulaglutide) Ozempic, Wegovy (semaglutide) Mounjaro (tirzepatide) Bydureon Bcise (exanatide extended release)  DO NOT TAKE 1 DAY PRIOR TO YOUR TEST Rybelsus (semaglutide) Adlyxin (lixisenatide) Victoza (liraglutide) Byetta (exanatide) ___________________________________________________________________________  Please follow up sooner if symptoms increase or worsen   Due to recent changes in healthcare laws, you may see the results of your imaging and laboratory studies on MyChart before your provider has had a chance to review them.  We understand that in some cases there may be results that are confusing or concerning to you. Not all laboratory results come back in the same time frame and the provider may be waiting for multiple results in order to interpret others.  Please give us  48 hours in order for your provider to thoroughly review all the results before contacting the office for clarification of your results.   Thank you for trusting me with your gastrointestinal care!   Ellouise Console, PA-C _______________________________________________________  If your blood pressure at your visit was 140/90 or greater, please contact your primary care physician to follow up on this.  _______________________________________________________  If you are age 44 or older, your body mass index should be between 23-30. Your Body mass index is 36.11 kg/m. If this is out of the aforementioned range listed, please consider follow up with your Primary Care Provider.  If you are age 71 or younger, your body mass index should be between 19-25. Your Body mass index is 36.11 kg/m. If this is out of the  aformentioned range listed, please consider follow up with your Primary Care Provider.   ________________________________________________________  The [facility] providers would like to encourage you to use MYCHART to communicate with providers for non-urgent requests or questions.  Due to long hold times on the telephone, sending your provider a message by [facility] may be a faster and more efficient way to get a response.  Please allow 48 business hours for a response.  Please remember that this is for non-urgent requests.  _______________________________________________________
